# Patient Record
Sex: Male | Born: 1935 | Race: White | Hispanic: No | Marital: Married
Health system: Southern US, Community
[De-identification: ages and names within clinical notes are randomized; demographics above are authoritative.]

---

## 2006-08-22 ENCOUNTER — Ambulatory Visit: Payer: Self-pay | Admitting: Unknown Physician Specialty

## 2007-01-02 ENCOUNTER — Inpatient Hospital Stay (HOSPITAL_COMMUNITY): Admission: RE | Admit: 2007-01-02 | Discharge: 2007-01-06 | Payer: Self-pay | Admitting: Orthopedic Surgery

## 2007-01-11 ENCOUNTER — Ambulatory Visit: Payer: Self-pay | Admitting: Internal Medicine

## 2007-01-19 ENCOUNTER — Encounter: Payer: Self-pay | Admitting: Orthopedic Surgery

## 2007-01-28 ENCOUNTER — Encounter: Payer: Self-pay | Admitting: Orthopedic Surgery

## 2007-02-03 ENCOUNTER — Ambulatory Visit: Payer: Self-pay | Admitting: Vascular Surgery

## 2007-02-03 ENCOUNTER — Ambulatory Visit: Admission: RE | Admit: 2007-02-03 | Discharge: 2007-02-03 | Payer: Self-pay | Admitting: Orthopedic Surgery

## 2007-02-28 ENCOUNTER — Encounter: Payer: Self-pay | Admitting: Orthopedic Surgery

## 2007-03-30 ENCOUNTER — Encounter: Payer: Self-pay | Admitting: Orthopedic Surgery

## 2007-04-30 ENCOUNTER — Encounter: Payer: Self-pay | Admitting: Orthopedic Surgery

## 2009-11-10 ENCOUNTER — Ambulatory Visit: Payer: Self-pay | Admitting: Unknown Physician Specialty

## 2011-03-12 ENCOUNTER — Ambulatory Visit: Payer: Self-pay | Admitting: Internal Medicine

## 2011-03-16 ENCOUNTER — Ambulatory Visit: Payer: Self-pay | Admitting: Surgery

## 2011-03-18 ENCOUNTER — Observation Stay: Payer: Self-pay | Admitting: Surgery

## 2011-12-13 ENCOUNTER — Ambulatory Visit: Payer: Self-pay | Admitting: Internal Medicine

## 2012-01-15 ENCOUNTER — Emergency Department: Payer: Self-pay | Admitting: *Deleted

## 2012-03-16 ENCOUNTER — Ambulatory Visit: Payer: Self-pay | Admitting: Internal Medicine

## 2012-04-13 ENCOUNTER — Ambulatory Visit: Payer: Self-pay | Admitting: Internal Medicine

## 2014-10-22 LAB — PROTIME-INR
INR: 2.3
PROTHROMBIN TIME: 24.7 s — AB (ref 11.5–14.7)

## 2014-10-22 LAB — CBC WITH DIFFERENTIAL/PLATELET
BASOS ABS: 0 10*3/uL (ref 0.0–0.1)
BASOS PCT: 0.4 %
EOS PCT: 2.8 %
Eosinophil #: 0.3 10*3/uL (ref 0.0–0.7)
HCT: 43.3 % (ref 40.0–52.0)
HGB: 14.4 g/dL (ref 13.0–18.0)
LYMPHS PCT: 25.6 %
Lymphocyte #: 2.6 10*3/uL (ref 1.0–3.6)
MCH: 31 pg (ref 26.0–34.0)
MCHC: 33.3 g/dL (ref 32.0–36.0)
MCV: 93 fL (ref 80–100)
Monocyte #: 1.1 x10 3/mm — ABNORMAL HIGH (ref 0.2–1.0)
Monocyte %: 10.2 %
NEUTROS ABS: 6.3 10*3/uL (ref 1.4–6.5)
NEUTROS PCT: 61 %
PLATELETS: 147 10*3/uL — AB (ref 150–440)
RBC: 4.66 10*6/uL (ref 4.40–5.90)
RDW: 13.7 % (ref 11.5–14.5)
WBC: 10.3 10*3/uL (ref 3.8–10.6)

## 2014-10-22 LAB — TROPONIN I

## 2014-10-22 LAB — BASIC METABOLIC PANEL
Anion Gap: 7 (ref 7–16)
BUN: 23 mg/dL — AB (ref 7–18)
CALCIUM: 8.6 mg/dL (ref 8.5–10.1)
Chloride: 106 mmol/L (ref 98–107)
Co2: 30 mmol/L (ref 21–32)
Creatinine: 1.15 mg/dL (ref 0.60–1.30)
EGFR (African American): >60
Glucose: 100 mg/dL — ABNORMAL HIGH (ref 65–99)
OSMOLALITY: 289 (ref 275–301)
Potassium: 3.6 mmol/L (ref 3.5–5.1)
Sodium: 143 mmol/L (ref 136–145)

## 2014-10-23 ENCOUNTER — Observation Stay: Payer: Self-pay | Admitting: Internal Medicine

## 2014-10-23 LAB — LIPASE, BLOOD: Lipase: 71 U/L — ABNORMAL LOW (ref 73–393)

## 2014-10-24 LAB — CBC WITH DIFFERENTIAL/PLATELET
BASOS ABS: 0 10*3/uL (ref 0.0–0.1)
BASOS PCT: 0.3 %
EOS ABS: 0.2 10*3/uL (ref 0.0–0.7)
Eosinophil %: 2.5 %
HCT: 35.9 % — ABNORMAL LOW (ref 40.0–52.0)
HGB: 11.7 g/dL — ABNORMAL LOW (ref 13.0–18.0)
Lymphocyte #: 1.3 10*3/uL (ref 1.0–3.6)
Lymphocyte %: 16.4 %
MCH: 30.7 pg (ref 26.0–34.0)
MCHC: 32.7 g/dL (ref 32.0–36.0)
MCV: 94 fL (ref 80–100)
MONO ABS: 0.6 x10 3/mm (ref 0.2–1.0)
MONOS PCT: 8.1 %
NEUTROS PCT: 72.7 %
Neutrophil #: 5.7 10*3/uL (ref 1.4–6.5)
Platelet: 117 10*3/uL — ABNORMAL LOW (ref 150–440)
RBC: 3.82 10*6/uL — ABNORMAL LOW (ref 4.40–5.90)
RDW: 13.5 % (ref 11.5–14.5)
WBC: 7.8 10*3/uL (ref 3.8–10.6)

## 2014-10-24 LAB — BASIC METABOLIC PANEL
ANION GAP: 6 — AB (ref 7–16)
BUN: 24 mg/dL — ABNORMAL HIGH (ref 7–18)
CALCIUM: 7.9 mg/dL — AB (ref 8.5–10.1)
CO2: 31 mmol/L (ref 21–32)
Chloride: 106 mmol/L (ref 98–107)
Creatinine: 1.05 mg/dL (ref 0.60–1.30)
EGFR (African American): >60
EGFR (Non-African Amer.): >60
GLUCOSE: 84 mg/dL (ref 65–99)
OSMOLALITY: 288 (ref 275–301)
Potassium: 3.1 mmol/L — ABNORMAL LOW (ref 3.5–5.1)
Sodium: 143 mmol/L (ref 136–145)

## 2014-10-25 LAB — CBC WITH DIFFERENTIAL/PLATELET
BASOS ABS: 0 10*3/uL (ref 0.0–0.1)
BASOS PCT: 0.3 %
Eosinophil #: 0.2 10*3/uL (ref 0.0–0.7)
Eosinophil %: 2.5 %
HCT: 35.3 % — AB (ref 40.0–52.0)
HGB: 11.8 g/dL — AB (ref 13.0–18.0)
Lymphocyte #: 1.3 10*3/uL (ref 1.0–3.6)
Lymphocyte %: 15.5 %
MCH: 31.2 pg (ref 26.0–34.0)
MCHC: 33.5 g/dL (ref 32.0–36.0)
MCV: 93 fL (ref 80–100)
Monocyte #: 0.9 x10 3/mm (ref 0.2–1.0)
Monocyte %: 10.1 %
NEUTROS ABS: 6.2 10*3/uL (ref 1.4–6.5)
NEUTROS PCT: 71.6 %
PLATELETS: 120 10*3/uL — AB (ref 150–440)
RBC: 3.79 10*6/uL — ABNORMAL LOW (ref 4.40–5.90)
RDW: 13.3 % (ref 11.5–14.5)
WBC: 8.6 10*3/uL (ref 3.8–10.6)

## 2014-10-25 LAB — BASIC METABOLIC PANEL
Anion Gap: 2 — ABNORMAL LOW (ref 7–16)
BUN: 23 mg/dL — ABNORMAL HIGH (ref 7–18)
CHLORIDE: 102 mmol/L (ref 98–107)
Calcium, Total: 8.1 mg/dL — ABNORMAL LOW (ref 8.5–10.1)
Co2: 35 mmol/L — ABNORMAL HIGH (ref 21–32)
Creatinine: 1.1 mg/dL (ref 0.60–1.30)
Glucose: 93 mg/dL (ref 65–99)
Osmolality: 281 (ref 275–301)
POTASSIUM: 3.2 mmol/L — AB (ref 3.5–5.1)
SODIUM: 139 mmol/L (ref 136–145)

## 2014-10-25 LAB — PROTIME-INR
INR: 2
Prothrombin Time: 22.1 secs — ABNORMAL HIGH (ref 11.5–14.7)

## 2014-10-28 ENCOUNTER — Inpatient Hospital Stay: Payer: Self-pay | Admitting: Internal Medicine

## 2014-10-28 DIAGNOSIS — R110 Nausea: Secondary | ICD-10-CM

## 2014-10-28 LAB — CBC WITH DIFFERENTIAL/PLATELET
BASOS ABS: 0.1 10*3/uL (ref 0.0–0.1)
Basophil %: 0.5 %
EOS ABS: 0.1 10*3/uL (ref 0.0–0.7)
EOS PCT: 0.4 %
HCT: 35.6 % — AB (ref 40.0–52.0)
HGB: 11.5 g/dL — AB (ref 13.0–18.0)
LYMPHS ABS: 0.6 10*3/uL — AB (ref 1.0–3.6)
Lymphocyte %: 4.6 %
MCH: 30.5 pg (ref 26.0–34.0)
MCHC: 32.4 g/dL (ref 32.0–36.0)
MCV: 94 fL (ref 80–100)
Monocyte #: 1 x10 3/mm (ref 0.2–1.0)
Monocyte %: 7.5 %
NEUTROS ABS: 11.9 10*3/uL — AB (ref 1.4–6.5)
Neutrophil %: 87 %
PLATELETS: 238 10*3/uL (ref 150–440)
RBC: 3.78 10*6/uL — ABNORMAL LOW (ref 4.40–5.90)
RDW: 13.3 % (ref 11.5–14.5)
WBC: 13.7 10*3/uL — ABNORMAL HIGH (ref 3.8–10.6)

## 2014-10-28 LAB — URINALYSIS, COMPLETE
BILIRUBIN, UR: NEGATIVE
BLOOD: NEGATIVE
GLUCOSE, UR: NEGATIVE mg/dL (ref 0–75)
Hyaline Cast: 6
Ketone: NEGATIVE
Leukocyte Esterase: NEGATIVE
Nitrite: NEGATIVE
PH: 5 (ref 4.5–8.0)
Protein: 30
RBC,UR: 2 /HPF (ref 0–5)
Specific Gravity: 1.027 (ref 1.003–1.030)
WBC UR: 3 /HPF (ref 0–5)

## 2014-10-28 LAB — PROTIME-INR
INR: 2.6
PROTHROMBIN TIME: 27.3 s — AB (ref 11.5–14.7)

## 2014-10-28 LAB — COMPREHENSIVE METABOLIC PANEL
ANION GAP: 7 (ref 7–16)
AST: 20 U/L (ref 15–37)
Albumin: 2.7 g/dL — ABNORMAL LOW (ref 3.4–5.0)
Alkaline Phosphatase: 88 U/L
BILIRUBIN TOTAL: 1.5 mg/dL — AB (ref 0.2–1.0)
BUN: 30 mg/dL — AB (ref 7–18)
CALCIUM: 8.8 mg/dL (ref 8.5–10.1)
CO2: 30 mmol/L (ref 21–32)
Chloride: 101 mmol/L (ref 98–107)
Creatinine: 1.49 mg/dL — ABNORMAL HIGH (ref 0.60–1.30)
EGFR (African American): 59 — ABNORMAL LOW
EGFR (Non-African Amer.): 48 — ABNORMAL LOW
Glucose: 137 mg/dL — ABNORMAL HIGH (ref 65–99)
OSMOLALITY: 284 (ref 275–301)
Potassium: 4.2 mmol/L (ref 3.5–5.1)
SGPT (ALT): 18 U/L
Sodium: 138 mmol/L (ref 136–145)
TOTAL PROTEIN: 6.1 g/dL — AB (ref 6.4–8.2)

## 2014-10-28 LAB — TROPONIN I: Troponin-I: <0.02 ng/mL

## 2014-10-29 LAB — BASIC METABOLIC PANEL
Anion Gap: 9 (ref 7–16)
BUN: 30 mg/dL — AB (ref 7–18)
CREATININE: 1.44 mg/dL — AB (ref 0.60–1.30)
Calcium, Total: 8.2 mg/dL — ABNORMAL LOW (ref 8.5–10.1)
Chloride: 102 mmol/L (ref 98–107)
Co2: 28 mmol/L (ref 21–32)
EGFR (African American): >60
EGFR (Non-African Amer.): 50 — ABNORMAL LOW
Glucose: 103 mg/dL — ABNORMAL HIGH (ref 65–99)
OSMOLALITY: 284 (ref 275–301)
POTASSIUM: 3.4 mmol/L — AB (ref 3.5–5.1)
SODIUM: 139 mmol/L (ref 136–145)

## 2014-10-29 LAB — CBC WITH DIFFERENTIAL/PLATELET
BASOS ABS: 0 10*3/uL (ref 0.0–0.1)
Basophil %: 0.1 %
EOS ABS: 0.2 10*3/uL (ref 0.0–0.7)
EOS PCT: 1.9 %
HCT: 32.8 % — AB (ref 40.0–52.0)
HGB: 10.8 g/dL — ABNORMAL LOW (ref 13.0–18.0)
LYMPHS PCT: 14.3 %
Lymphocyte #: 1.5 10*3/uL (ref 1.0–3.6)
MCH: 30.7 pg (ref 26.0–34.0)
MCHC: 32.8 g/dL (ref 32.0–36.0)
MCV: 94 fL (ref 80–100)
Monocyte #: 1 x10 3/mm (ref 0.2–1.0)
Monocyte %: 10 %
NEUTROS PCT: 73.7 %
Neutrophil #: 7.5 10*3/uL — ABNORMAL HIGH (ref 1.4–6.5)
PLATELETS: 192 10*3/uL (ref 150–440)
RBC: 3.51 10*6/uL — ABNORMAL LOW (ref 4.40–5.90)
RDW: 13.4 % (ref 11.5–14.5)
WBC: 10.2 10*3/uL (ref 3.8–10.6)

## 2014-10-29 LAB — MAGNESIUM: MAGNESIUM: 1.8 mg/dL

## 2014-10-30 LAB — CBC WITH DIFFERENTIAL/PLATELET
BASOS ABS: 0 10*3/uL (ref 0.0–0.1)
BASOS PCT: 0.3 %
Eosinophil #: 0.3 10*3/uL (ref 0.0–0.7)
Eosinophil %: 3.5 %
HCT: 31.9 % — AB (ref 40.0–52.0)
HGB: 10.6 g/dL — AB (ref 13.0–18.0)
LYMPHS PCT: 10.4 %
Lymphocyte #: 1 10*3/uL (ref 1.0–3.6)
MCH: 31 pg (ref 26.0–34.0)
MCHC: 33.2 g/dL (ref 32.0–36.0)
MCV: 93 fL (ref 80–100)
Monocyte #: 1.2 x10 3/mm — ABNORMAL HIGH (ref 0.2–1.0)
Monocyte %: 12.9 %
Neutrophil #: 6.8 10*3/uL — ABNORMAL HIGH (ref 1.4–6.5)
Neutrophil %: 72.9 %
Platelet: 212 10*3/uL (ref 150–440)
RBC: 3.42 10*6/uL — AB (ref 4.40–5.90)
RDW: 13.6 % (ref 11.5–14.5)
WBC: 9.3 10*3/uL (ref 3.8–10.6)

## 2014-10-30 LAB — COMPREHENSIVE METABOLIC PANEL
ALK PHOS: 78 U/L
AST: 21 U/L (ref 15–37)
Albumin: 2.1 g/dL — ABNORMAL LOW (ref 3.4–5.0)
Anion Gap: 5 — ABNORMAL LOW (ref 7–16)
BUN: 26 mg/dL — ABNORMAL HIGH (ref 7–18)
Bilirubin,Total: 1.1 mg/dL — ABNORMAL HIGH (ref 0.2–1.0)
CO2: 27 mmol/L (ref 21–32)
CREATININE: 1.34 mg/dL — AB (ref 0.60–1.30)
Calcium, Total: 8.4 mg/dL — ABNORMAL LOW (ref 8.5–10.1)
Chloride: 107 mmol/L (ref 98–107)
EGFR (African American): >60
GFR CALC NON AF AMER: 55 — AB
Glucose: 120 mg/dL — ABNORMAL HIGH (ref 65–99)
OSMOLALITY: 283 (ref 275–301)
Potassium: 4.3 mmol/L (ref 3.5–5.1)
SGPT (ALT): 24 U/L
SODIUM: 139 mmol/L (ref 136–145)
TOTAL PROTEIN: 5.1 g/dL — AB (ref 6.4–8.2)

## 2014-10-30 LAB — PROTIME-INR
INR: 2.3
Prothrombin Time: 24.6 secs — ABNORMAL HIGH (ref 11.5–14.7)

## 2014-10-31 ENCOUNTER — Encounter: Payer: Self-pay | Admitting: Internal Medicine

## 2014-10-31 LAB — HEMATOCRIT: HCT: 33.6 % — ABNORMAL LOW (ref 40.0–52.0)

## 2014-10-31 LAB — PROTIME-INR
INR: 1.8
PROTHROMBIN TIME: 20.6 s — AB (ref 11.5–14.7)

## 2014-10-31 LAB — CREATININE, SERUM
Creatinine: 1.42 mg/dL — ABNORMAL HIGH (ref 0.60–1.30)
EGFR (Non-African Amer.): 51 — ABNORMAL LOW

## 2014-10-31 LAB — VANCOMYCIN, TROUGH: VANCOMYCIN, TROUGH: 15 ug/mL (ref 10–20)

## 2014-11-01 LAB — PROTIME-INR
INR: 1.9
PROTHROMBIN TIME: 21 s — AB (ref 11.5–14.7)

## 2014-11-01 LAB — WOUND CULTURE

## 2014-11-02 LAB — CBC WITH DIFFERENTIAL/PLATELET
BASOS PCT: 0.4 %
Basophil #: 0 10*3/uL (ref 0.0–0.1)
EOS ABS: 0.5 10*3/uL (ref 0.0–0.7)
Eosinophil %: 4.8 %
HCT: 32.1 % — AB (ref 40.0–52.0)
HGB: 10.7 g/dL — AB (ref 13.0–18.0)
LYMPHS ABS: 1.1 10*3/uL (ref 1.0–3.6)
LYMPHS PCT: 11.1 %
MCH: 30.7 pg (ref 26.0–34.0)
MCHC: 33.3 g/dL (ref 32.0–36.0)
MCV: 92 fL (ref 80–100)
MONO ABS: 0.8 x10 3/mm (ref 0.2–1.0)
Monocyte %: 7.9 %
NEUTROS PCT: 75.8 %
Neutrophil #: 7.8 10*3/uL — ABNORMAL HIGH (ref 1.4–6.5)
Platelet: 300 10*3/uL (ref 150–440)
RBC: 3.48 10*6/uL — AB (ref 4.40–5.90)
RDW: 13.4 % (ref 11.5–14.5)
WBC: 10.3 10*3/uL (ref 3.8–10.6)

## 2014-11-02 LAB — BASIC METABOLIC PANEL
Anion Gap: 3 — ABNORMAL LOW (ref 7–16)
BUN: 29 mg/dL — AB (ref 7–18)
CHLORIDE: 106 mmol/L (ref 98–107)
CO2: 28 mmol/L (ref 21–32)
CREATININE: 1.32 mg/dL — AB (ref 0.60–1.30)
Calcium, Total: 8.5 mg/dL (ref 8.5–10.1)
GFR CALC NON AF AMER: 56 — AB
GLUCOSE: 108 mg/dL — AB (ref 65–99)
Osmolality: 280 (ref 275–301)
POTASSIUM: 3.9 mmol/L (ref 3.5–5.1)
SODIUM: 137 mmol/L (ref 136–145)

## 2014-11-02 LAB — CULTURE, BLOOD (SINGLE)

## 2014-11-02 LAB — PROTIME-INR
INR: 1.8
Prothrombin Time: 20.9 secs — ABNORMAL HIGH (ref 11.5–14.7)

## 2014-11-03 LAB — CBC WITH DIFFERENTIAL/PLATELET
BASOS ABS: 0 10*3/uL (ref 0.0–0.1)
Basophil %: 0.4 %
Eosinophil #: 0.4 10*3/uL (ref 0.0–0.7)
Eosinophil %: 3.8 %
HCT: 34 % — AB (ref 40.0–52.0)
HGB: 11.2 g/dL — ABNORMAL LOW (ref 13.0–18.0)
LYMPHS ABS: 1.2 10*3/uL (ref 1.0–3.6)
Lymphocyte %: 11.2 %
MCH: 30.1 pg (ref 26.0–34.0)
MCHC: 33.1 g/dL (ref 32.0–36.0)
MCV: 91 fL (ref 80–100)
MONO ABS: 0.7 x10 3/mm (ref 0.2–1.0)
Monocyte %: 6.8 %
NEUTROS ABS: 8.2 10*3/uL — AB (ref 1.4–6.5)
NEUTROS PCT: 77.8 %
Platelet: 348 10*3/uL (ref 150–440)
RBC: 3.73 10*6/uL — AB (ref 4.40–5.90)
RDW: 13.4 % (ref 11.5–14.5)
WBC: 10.6 10*3/uL (ref 3.8–10.6)

## 2014-11-03 LAB — BASIC METABOLIC PANEL
ANION GAP: 10 (ref 7–16)
BUN: 26 mg/dL — ABNORMAL HIGH (ref 7–18)
CREATININE: 1.22 mg/dL (ref 0.60–1.30)
Calcium, Total: 8.3 mg/dL — ABNORMAL LOW (ref 8.5–10.1)
Chloride: 106 mmol/L (ref 98–107)
Co2: 27 mmol/L (ref 21–32)
EGFR (Non-African Amer.): >60
Glucose: 112 mg/dL — ABNORMAL HIGH (ref 65–99)
Osmolality: 290 (ref 275–301)
Potassium: 3.9 mmol/L (ref 3.5–5.1)
Sodium: 143 mmol/L (ref 136–145)

## 2014-11-03 LAB — PROTIME-INR
INR: 1.8
Prothrombin Time: 20.2 secs — ABNORMAL HIGH (ref 11.5–14.7)

## 2014-11-03 LAB — VANCOMYCIN, TROUGH: VANCOMYCIN, TROUGH: 17 ug/mL (ref 10–20)

## 2014-11-04 LAB — CBC WITH DIFFERENTIAL/PLATELET
Basophil #: 0 10*3/uL (ref 0.0–0.1)
Basophil %: 0.4 %
EOS ABS: 0.3 10*3/uL (ref 0.0–0.7)
Eosinophil %: 3.1 %
HCT: 32 % — ABNORMAL LOW (ref 40.0–52.0)
HGB: 10.7 g/dL — AB (ref 13.0–18.0)
Lymphocyte #: 1.2 10*3/uL (ref 1.0–3.6)
Lymphocyte %: 12.1 %
MCH: 30.2 pg (ref 26.0–34.0)
MCHC: 33.4 g/dL (ref 32.0–36.0)
MCV: 91 fL (ref 80–100)
MONOS PCT: 7.1 %
Monocyte #: 0.7 x10 3/mm (ref 0.2–1.0)
NEUTROS PCT: 77.3 %
Neutrophil #: 7.7 10*3/uL — ABNORMAL HIGH (ref 1.4–6.5)
PLATELETS: 327 10*3/uL (ref 150–440)
RBC: 3.54 10*6/uL — AB (ref 4.40–5.90)
RDW: 13.3 % (ref 11.5–14.5)
WBC: 9.9 10*3/uL (ref 3.8–10.6)

## 2014-11-04 LAB — BASIC METABOLIC PANEL
Anion Gap: 9 (ref 7–16)
BUN: 22 mg/dL — ABNORMAL HIGH (ref 7–18)
CALCIUM: 8.5 mg/dL (ref 8.5–10.1)
Chloride: 106 mmol/L (ref 98–107)
Co2: 28 mmol/L (ref 21–32)
Creatinine: 1.15 mg/dL (ref 0.60–1.30)
EGFR (African American): >60
Glucose: 97 mg/dL (ref 65–99)
OSMOLALITY: 288 (ref 275–301)
Potassium: 3.7 mmol/L (ref 3.5–5.1)
Sodium: 143 mmol/L (ref 136–145)

## 2014-11-04 LAB — PROTIME-INR
INR: 1.8
Prothrombin Time: 20.9 secs — ABNORMAL HIGH (ref 11.5–14.7)

## 2014-11-08 ENCOUNTER — Encounter: Payer: Self-pay | Admitting: Surgery

## 2014-11-29 ENCOUNTER — Encounter: Payer: Self-pay | Admitting: Surgery

## 2014-11-29 ENCOUNTER — Encounter: Payer: Self-pay | Admitting: Internal Medicine

## 2014-12-30 ENCOUNTER — Encounter: Payer: Self-pay | Admitting: Surgery

## 2015-01-01 ENCOUNTER — Ambulatory Visit: Payer: Self-pay | Admitting: Internal Medicine

## 2015-01-28 ENCOUNTER — Encounter
Admit: 2015-01-28 | Disposition: A | Discharge status: Home / Self Care | Payer: Self-pay | Attending: Surgery | Admitting: Surgery

## 2015-02-28 ENCOUNTER — Encounter
Admit: 2015-02-28 | Disposition: A | Discharge status: Home / Self Care | Payer: Self-pay | Attending: Surgery | Admitting: Surgery

## 2015-03-22 NOTE — Discharge Summary (Signed)
PATIENT NAME:  Nathan Kirby, Nathan Kirby MR#:  [id number] DATE OF BIRTH:  [birth year]  DATE OF ADMISSION:  10/23/2014 DATE OF DISCHARGE:    REASON FOR ADMISSION: Left lower extremity trauma with laceration.   HISTORY OF PRESENT ILLNESS: Please see the dictated HPI done by Dr. Sheryle Hailiamond on 10/23/2014.   PAST MEDICAL HISTORY: 1.  Chronic atrial fibrillation, on anticoagulation.  2.  History of DVT.   3.  Status post left knee surgery.  4.  Chronic peripheral edema.  5.  History of squamous cell melanoma.  6.  Status post cholecystectomy.  7.  History of CHF.    MEDICATIONS ON ADMISSION: Please see admission note.   ALLERGIES: IODINE, CONTRAST DYE, AND SHELLFISH.   SOCIAL HISTORY: Negative for alcohol or tobacco abuse.   FAMILY HISTORY: Positive for hypertension and stroke.   REVIEW OF SYSTEMS: As per admission note.   PHYSICAL EXAMINATION: GENERAL: The patient was in no acute distress.  VITAL SIGNS: Stable and he was afebrile.  HEENT: Unremarkable.  NECK: Supple without JVD.  LUNGS: Clear.  CARDIAC: Irregularly irregular rhythm.  ABDOMEN: Soft and nontender.  EXTREMITIES: Revealed 2+ edema.  NEUROLOGIC: Grossly nonfocal.   HOSPITAL COURSE: The patient was admitted with left lower extremity trauma and laceration. His Coumadin was held. He was seen in consultation by surgery. Sutures were applied with a Betadine dressing. The patient developed severe left lower extremity pain. Ultrasound was negative for DVT. He was placed back on his Coumadin. This morning, he is asymptomatic. No fever. No sign of infection. His INR is therapeutic. He is now discharged home in stable condition.   DISCHARGE DIAGNOSES: 1.  Left lower extremity trauma with laceration requiring stitching.  2.  Chronic atrial fibrillation.  3.  History of deep vein thrombosis.  4.  History of congestive heart failure.  5.  Chronic peripheral edema.   DISCHARGE MEDICATIONS: 1.  Norco 5/325, 1 p.o. to 2 p.o. q. 4 hours  p.r.n. pain.  2.  Lipitor 40 mg p.o. daily.  3.  Lasix 40 mg p.o. daily.  4.  Lopressor 100 mg p.o. b.i.d.  5.  K-Dur 10 mEq p.o. daily.  6.  Flomax 0.4 mg p.o. daily.  7.  Coumadin 4 mg p.o. q.p.m.  8.  Keflex 500 mg p.o. q. 8 hours.   FOLLOWUP PLANS AND APPOINTMENTS: The patient was discharged on a low sodium diet. He will follow up with Dr. Graciela HusbandsKlein next week, sooner if needed.    ____________________________ Duane LopeJeffrey D. Judithann SheenSparks, MD jds:at D: 10/25/2014 08:50:58 ET T: 10/25/2014 09:26:18 ET JOB#: [id number]  cc: Duane LopeJeffrey D. Judithann SheenSparks, MD, <Dictator> Culver Feighner Rodena Medin Elasha Tess MD ELECTRONICALLY SIGNED 10/28/2014 21:13

## 2015-03-22 NOTE — Op Note (Signed)
PATIENT NAME:  Nathan Kirby, Nathan Kirby DATE OF BIRTH:  1933/08/18  DATE OF PROCEDURE:  10/28/2014  PREOPERATIVE DIAGNOSIS: Hematoma with cellulitis of the left leg with skin necrosis.   POSTOPERATIVE DIAGNOSIS: Hematoma with cellulitis of the left leg with skin necrosis.   PROCEDURE: Incision and drainage of hematoma of the left leg with sharp debridement.   DESCRIPTION OF PROCEDURE:  With the patient in the hospital bed the dressing was removed from the left leg, seeing the wound with sutures intact and a black dry eschar. This wound spanned some 4.5 cm. There was surrounding erythema and swelling.    The site was prepared with alcohol solution.    The nylon sutures were removed. Next the eschar was sharply excised with a scalpel and also some use of scissors. This eschar was approximately 1 x 3 cm in dimension. There was hematoma formation beneath this which was evacuated with the surgical instruments and also applied some local pressure which helped to cause hematoma to come out, removing approximately 3-4 mL of hematoma. The hematoma was cultured and culture swab was sent in transport  media to the laboratory for routine culture.  It is noted that there was no active bleeding during this procedure and it was tolerated well. 4 x 4 gauze dressings were applied with a Kerlix and also a 4 inch Ace wrap which the Ace wrap was begun at the ball of the foot and carried up to the top of the dressing.   Wound care instructions passed along through the medical record system to the nurses and also discussed with nurse wound care.     ____________________________ J. Renda RollsWilton Smith, MD jws:bu D: 10/28/2014 18:11:56 ET T: 10/28/2014 19:43:34 ET JOB#: [id number]  cc: Adella HareJ. Nathan Smith, MD, <Dictator> Adella HareWILTON J SMITH MD ELECTRONICALLY SIGNED 11/08/2014 19:27

## 2015-03-22 NOTE — H&P (Signed)
PATIENT NAME:  Nathan Kirby, Nathan Kirby MR#:  [id number] DATE OF BIRTH:  Aug 31, 1933  DATE OF ADMISSION:  10/23/2014  REFERRING PHYSICIAN: Dr. Langston MaskerShaevitz.  PRIMARY CARE PHYSICIAN: Lynnea FerrierBert J. Klein III, M.D.   ADMISSION DIAGNOSIS: Intractable vomiting.   HISTORY OF PRESENT ILLNESS: This is a 81 year old, Caucasian male, who presents to the Emergency Department after suffering a fall. The patient lost his footing and fell onto a wash pale that he was holding in his hand. He landed on his chest. The patient has a history of chronic atrial fibrillation and is on anticoagulation. He decided to come to the Emergency Department for evaluation, also because he cut his left lower extremity on a rock wall. In the Emergency Department, he had a normal head CT and a CT of his abdomen, which did not reveal any organ damage or internal bleeding, but his leg did require suturing. He had some significant oozing as his INR is elevated from anticoagulation. A pressure dressing was placed over the wound and hemostasis was eventually achieved. Throughout this course, however, the patient has had multiple episodes of nonbloody vomiting that was very bilious in appearance. After multiple doses of antiemetic, he still felt nauseous and had some vomiting, which prompted the Emergency Department to call for admission.   REVIEW OF SYSTEMS:    CONSTITUTIONAL: The patient denies fever or weakness.   EYES: Denies inflammation or blurred vision.  ENT: Denies tinnitus or sore throat.  RESPIRATORY: Denies cough or shortness of breath.  CARDIOVASCULAR: Denies palpitations, orthopnea, or paroxysmal nocturnal dyspnea. The patient does have reproducible chest pain that is across the upper quadrants of his chest.  GASTROINTESTINAL: Denies diarrhea, but admits to nausea, vomiting and abdominal pain.  GENITOURINARY: Denies dysuria, increased frequency, or hesitancy of urination.  ENDOCRINE: Denies polyuria, polydipsia.  HEMATOLOGIC AND LYMPHATIC:  Admits to easy bruising and easy bleeding.  INTEGUMENTARY: Denies rashes or lesions.  MUSCULOSKELETAL: Denies myalgias or arthralgias.  NEUROLOGIC: Denies numbness in his extremities or dysarthria.  PSYCHIATRIC: Denies depression or suicidal ideation.   PAST MEDICAL HISTORY: Atrial fibrillation and congestive heart failure.   SURGICAL HISTORY: Left total knee replacement, cholecystectomy, and squamous cell melanoma removal from his scalp and lower right extremity.   SOCIAL HISTORY: The patient lives with his wife. He does not smoke, drink or do any drugs. He is very able-bodied and able to perform his activities of daily living.   FAMILY HISTORY: Hypertension. Both of his parents are deceased of strokes.    MEDICATIONS: 1. Acetaminophen with hydrocodone 500 mg/5 mg oral capsule 1 to 2 tablets p.o. q.6 h. p.r.n.  2. Verapamil 180 mg extended release capsule 1 capsule p.o. daily.  3. Coumadin 4 mg 1 tablet p.o. daily.  4. Atorvastatin 40 mg 1 tablet p.o. every morning. 5. Diovan HCT 160 mg/12.5 mg tablet 1 tablet p.o. b.i.d.  6. Plavix 1 tablet p.o. daily.  7. Lopressor 1 tablet p.o. b.i.d.  8. Potassium chloride 10 mEq extended release 1 tablet p.o. every morning.  9. Prilosec 10 mg delayed release capsule 1 capsule p.o. as needed.   ALLERGIES: IODINE CONTRAST DYE, AS WELL AS SHELLFISH.   PERTINENT LABORATORY RESULTS AND RADIOGRAPHIC FINDINGS: Glucose is 100, BUN is 23, creatinine 1.15, sodium 143, potassium is 3.6, serum chloride is 106, bicarbonate is 30, calcium is 8.6. Troponin is negative. White blood cell count is 10.3, hemoglobin 14.4, hematocrit 43.3, platelet count is 147,000, MCV is 93. INR is 2.3, lipase 71.   X-ray of his left  tibia and fibula shows soft tissue changes consistent with localized hemorrhage. There are no bony abnormalities seen.   CT scan of the chest and abdomen shows no evidence of acute visceral injury. There are multiple small pulmonary nodules identified.  There is some mild soft tissue change consistent with localized bruising in the lateral abdominal walls, but no hematoma is identified.   CT scan of the head shows no acute intracranial abnormality.   CT scan of the spine shows multilevel degenerative changes without acute abnormality.   PHYSICAL EXAMINATION:  VITAL SIGNS: Temperature is 97.4, pulse 63, respirations 18, blood pressure is 130/78, pulse oximetry is 95% on room air.  GENERAL: The patient is alert and oriented x 3 in no apparent distress.  HEENT: Normocephalic, atraumatic. Pupils equal, round, and reactive to light and accommodation. Extraocular movements are intact. Mucous membranes are moist.  NECK: Trachea is midline. No adenopathy.  CHEST: Symmetric and atraumatic.  CARDIOVASCULAR: Regular rate and rhythm. Normal S1, S2. No rubs, clicks, or murmurs appreciated.  LUNGS: Clear to auscultation bilaterally. Normal effort and excursion.  ABDOMEN: Positive bowel sounds. Soft, mildly tender, and an area of bruising under the left rib cage. There is no rebound tenderness. There is no hepatosplenomegaly. The abdomen is soft without any evidence of increased intra-abdominal pressure.  GENITOURINARY: Normal external male genitalia.  MUSCULOSKELETAL: The patient moves all 4 extremities equally. There is 5/5 strength in upper and lower extremities bilaterally.  SKIN: No rashes, but the patient does have a laceration that is dressed with absorbent pads and Ace wraps. I have not removed the dressing so as not to disrupt hemostasis.  EXTREMITIES: No clubbing or cyanosis, but there is 1+ pitting edema of his feet, which is a chronic problem for the patient.  NEUROLOGIC: Cranial nerves II through XII are grossly intact.  PSYCHIATRIC: Mood is normal. Affect is congruent.   ASSESSMENT AND PLAN: This is a 81 year old male admitted for intractable vomiting.   1. His vomiting is bilious, but his CT scan of the abdomen is reassuring. There is no  organ damage or internal hemorrhage. We have ordered antiemetics for supportive care.  2. Atrial fibrillation. The patient is on Coumadin. His INR is 2.3. We will continue his anticoagulation as there is no evidence of acute intra-abdominal bleed.  3. Congestive heart failure. The patient is stable. He will require no intervention for this problem.  4. Left lower extremity laceration. The wound has been approximated and good hemostasis achieved with a pressure dressing. Surgical consult was obtained and does not recommend any further intervention at this time.  5. Deep vein thrombosis prophylaxis, mechanical.  6. Gastrointestinal prophylaxis, none.   CODE STATUS: The patient is a Full Code.   TIME SPENT ON ADMISSION ORDERS AND PATIENT CARE: Approximately 35 minutes.    ____________________________ Kelton Pillar. Sheryle Hail, MD msd:JT D: 10/23/2014 07:32:48 ET T: 10/23/2014 09:02:29 ET JOB#: [id number]  cc: Kelton Pillar. Sheryle Hail, MD, <Dictator> Kelton Pillar Lamoyne Palencia MD ELECTRONICALLY SIGNED 10/30/2014 23:58

## 2015-03-22 NOTE — Consult Note (Signed)
PATIENT NAME:  Nathan Kirby, Nathan Kirby MR#:  [id number] DATE OF BIRTH:  September 29, 1933  DATE OF CONSULTATION:  10/28/2014  CONSULTING PHYSICIAN:  Adella HareJ. Wilton Karynn Deblasi, MD  HISTORY OF PRESENT ILLNESS: This 81 year old male has a chief complaint of pain and swelling and erythema of the left lower leg. He has a history of chronic bilateral lower extremity edema, and last week had a fall sustaining a laceration of the anterior tibial aspect of the left leg. He has been taking warfarin and was anticoagulated and had initial evaluation last week by the Emergency Room staff, which included suture of the laceration which there was some bleeding during the procedure. He later was admitted to the hospital for 2 nights and had a period of observation with bed rest and elevation of the left leg with dressing changes using 4 x 4 cotton gauze, Kerlix and Ace wraps.  Since discharge, he has developed increased pain at this site and also developed some hypotension and weakness. He reports low-grade fever. He was seen by Dr. Daniel NonesBert Klein at the office this morning and was admitted to the hospital for further care. Was found to be hypotensive. He has been admitted and has been started on intravenous vancomycin and intravenous Levaquin.   PAST MEDICAL HISTORY: Does include atrial fibrillation, hypertension, chronic lower extremity edema.   MEDICATIONS: Include: 1.  Norco. 2.  Verapamil. 3.  Coumadin.  4.  Keflex.  5.  Lopressor.  6.  Potassium supplement. 7.  Flomax. 8.  Lasix. 9.  Lipitor.   REVIEW OF SYSTEMS: He reports weakness, but no dizziness. Does tend to get dyspneic  if he lays flat in bed consistent with orthopnea. He has been sleeping in a recliner chair at home.   PHYSICAL EXAMINATION:  GENERAL: He is in the hospital bed and awake, alert, and oriented.  VITAL SIGNS: Temperature 98, pulse 62, respirations 18, blood pressure 96/50.  LOWER EXTREMITIES: The dressing was removed from the anterior tibial region of the  left leg demonstrating diffuse edema of the leg and also diffuse edema of the opposite leg. There was a finding of erythema which is extending approximately 20 cm with some being below and some above the wound. There is local eschar formation, which is fluctuant, sutures remaining intact with dry wound, and some surrounding edema.   IMPRESSION: 1.  Skin necrosis at the site of his laceration.  2.  Hematoma.  3.  Cellulitis.   PLAN: 1.  I recommended incision and drainage of the hematoma with culture and debridement. Description of this will be dictated in a separate note.  2.  Agree with antibiotic treatment.  3.  I discussed with him bed rest with elevation of the left leg as high as possible related to his heart. Currently, he has two pillows under his leg and I discussed lowering the head of the bed as much as he can tolerate with his breathing but also walk some with assistance and with a Wadel to maintain strength.  4.  Culture swab has been sent.    ____________________________ Shela CommonsJ. Renda RollsWilton Yahayra Geis, MD jws:LT D: 10/28/2014 18:07:31 ET T: 10/28/2014 18:47:16 ET JOB#: [id number]  cc: Adella HareJ. Wilton Rito Lecomte, MD, <Dictator> Adella HareWILTON J Wylene Weissman MD ELECTRONICALLY SIGNED 11/08/2014 19:26

## 2015-03-22 NOTE — Discharge Summary (Signed)
PATIENT NAME:  Nathan Kirby, Nathan Kirby MR#:  [id number] DATE OF BIRTH:  [birth year]  DATE OF ADMISSION:  10/28/2014 DATE OF DISCHARGE:  11/04/2014  FINAL DIAGNOSES:  1. Left leg infected hematoma.  2. Left leg cellulitis.  3. Sepsis secondary to #1 and #2.  4. Atrial fibrillation, persistent.  5. Essential hypertension.  6. Chronic lower extremity edema and venous stasis.  7. Benign prostatic hypertrophy.  8. Hyperlipidemia.   PRINCIPLE PROCEDURES: Debridement of left leg wounds x2.   SUMMARY OF HOSPITAL COURSE: The patient was admitted after being found to have hypotension, dehydration, evidence of sepsis in the office, with finding of an infected hematoma in the left leg with cellulitis and necrotic tissue over the wound. Surgery was consultation and he underwent debridement of the area. He was placed on broad-spectrum antibiotics. Cultures were sent out, with coag negative staph growing out, which is likely a contaminant rather than a true pathogen. He was transitioned over to doxycycline and Augmentin. He remained afebrile, his blood pressure began to climb, and his dehydration resolved after IV fluid supplementation. His level of pain continued to limit his ambulation, however, as the leg wound improved, this improved significantly.  On the day of discharge, he is ambulating in the room independently with a Tompson, although his distance to ambulate remains a short. Initially, we had anticipated him going for skilled nursing, however, with improvement in both the wound and with the patient's request to go home, the plan will be now to discharge him to home with home health, physical therapy and nursing. He will be discharged in stable condition. His physical activity should be up with a Leder as tolerated. He should follow a 2 gram sodium diet. A followup will be set up with the wound clinic in we will have him follow up in our office in the next 5 to 6 days. Home health nursing and physical therapy  were ordered for the patient. He should keep the leg elevated, will need to keep it dry. Recommendation is for the leg wound to be cleansed with normal saline and patted gently dry. The wound should be covered with an 8 inch layer of Santyl cream, then topped with normal saline moistened gauze. This should then be covered with a 4 x 4 gauze and Kerlix. An ACE wrap should be applied over the top of this, starting at the base of the toes are wrapping to just below the knee. All of that will need to be done daily.   DISCHARGE MEDICATIONS:  1. Prilosec 10 mg p.o. daily.  2. Norco 5/325 1 p.o. q.4h. as needed for pain.  3. Tamsulosin 0.4 mg p.o. at bedtime.  4. Atorvastatin 40 mg p.o. at bedtime. 5. Metoprolol 100 mg p.o. b.i.d.  6. Coumadin 4 mg p.o. daily.  7. Diovan HCTZ 160/12.5 mg 1 p.o. daily.  8. Furosemide 40 mg p.o. daily as needed for leg edema.  9. Potassium 10 mEq p.o. daily as needed with furosemide.  10. Santyl cream 250 units/g, 1 application topically daily until the wound is healed.  11. Augmentin 500/125 mg, 1 p.o. b.i.d. x7 days.  12. Doxycycline 100 mg p.o. b.i.d. x7 days.   He will stop Plavix, hold verapamil for now. Stop Keflex. Of note, the patient's furosemide, Diovan HCTZ, and potassium doses were changed and instructions were given to the patient to make sure he took these medi cations as per the new directions.     ____________________________ Lynnea Ferrier, MD bjk:ap  D: 11/04/2014 13:11:00 ET T: 11/04/2014 13:28:26 ET JOB#: [id number]  cc: Lynnea FerrierBert J. Magda Muise III, MD, <Dictator> Daniel NonesBERT Bethel Gaglio MD ELECTRONICALLY SIGNED 11/05/2014 18:34

## 2015-03-22 NOTE — Consult Note (Signed)
PATIENT NAME:  Nathan Kirby, [name] MR#:  [id number] DATE OF BIRTH:  [birth year]  DATE OF CONSULTATION:  10/22/2014  REFERRING PHYSICIAN:  ER physician  CONSULTING PHYSICIAN:  S.G. Evette CristalSankar, MD  REASON FOR CONSULTATION:  Laceration to the left leg with some bleeding that was of concern.   HISTORY OF PRESENT ILLNESS:  This 81 year old male, who is on Coumadin for reportedly recurrent DVTs, had an accident this afternoon. He apparently fell and suffered a laceration on his left leg, which is bleeding significantly. He also had some bruising on his abdomen. The patient was evaluated initially in the ER and subsequently had a CT of his abdomen and x-rays of his leg, and after ensuring no internal injuries, attempt was made to suture the laceration to minimize bleeding. Apparently, an arterial bleeder was encountered, which was subsequently controlled with some mattress sutures, but the patient continued to ooze from the wound and seemed to saturate a dressing a couple of times, and I was asked to assess the wound to see if this was acceptable at this time.   PHYSICAL EXAMINATION:   GENERAL:  The patient appears to be alert and oriented. He is a little bit nauseated from the pain medicines he got, but otherwise he says he feels fine.  EXTREMITIES:  There is some moderate tenderness over the area of the laceration, which is dressed with a pressure dressing at this point. The dressing was then taken down completely to reveal a curved laceration repaired with some nylon sutures with some open spaces in-between. The patient has significant edema in both legs and apparently this has been a chronic problem. He does have a palpable dorsalis pedis pulse; the posterior tibial is somewhat difficult to feel. There is no suggestion of any arterial insufficiency. What is oozing from the wound is just a thin edema fluid mixed with some blood, and it is likely that this may continue because of the edema more so than from active  continued bleeding.   RECOMMENDATIONS:  At this point, it does not appear that there is any reason to reopen the wound, and I would favor pressure dressing to continue for the time being. With this in mind, a small piece of Adaptic, 4 x 4's, Kerlix, and an Ace wrap were applied. The patient was advised to rest and elevate the leg for the next 24 to 48 hours and follow up with Dr. Renda RollsWilton Smith, who has done surgery on him in the past, in the next few days.   Thank you for allowing me to evaluate and help in the care of this patient.     ____________________________ S.Wynona LunaG. Iyanna Drummer, MD sgs:nb D: 10/22/2014 23:19:57 ET T: 10/22/2014 23:37:58 ET JOB#: [id number]  cc: Timoteo ExposeS.G. Evette CristalSankar, MD, <Dictator> [facility] Wynona LunaG Wynton Hufstetler MD ELECTRONICALLY SIGNED 10/23/2014 19:28

## 2015-03-22 NOTE — H&P (Signed)
PATIENT NAME:  Nathan Kirby, Nathan Kirby MR#:  [id number] DATE OF BIRTH:  07/01/1933  DATE OF ADMISSION:  10/28/2014  CHIEF COMPLAINT: Hypotension and left leg pain.   HISTORY OF PRESENT ILLNESS: A 81 year old male who was admitted on 10/23/2014 after a fall. He had tripped while he was walking back from feeding some chickens. He cut his left leg, and had trouble getting this to stop bleeding. He has been chronically on Coumadin and this was part of the issue. He had the area sutured, but wound up in the hospital for several days trying to get his pain under control and get his bleeding to stop. His Coumadin initially had been held at that time. It was restarted before his discharge. He had trouble with nausea and vomiting during his hospitalization as well, and he was hydrated. He was seen by surgery at that time. He had continued difficulty with pain and so was in the hospital until the 27th, at which time he was discharged to home. His nausea and vomiting had resolved, in fact it was felt that this might have actually been due to pain medication.   He returned to the office today, actually showed up in the lobby and refused to go home. He developed nausea in the office, and was initially noted to have a blood pressure of 60/40, improving to 70/50 as his nausea improved. Examination of the wound reveals significant hematoma with oozing, with erythema which appeared to be tracking upward towards the knee, and diffuse tenderness. He had not had fevers, chills, however the area was worrisome for cellulitis, and so he was admitted with nausea, hypotension, likely with a vagal component, in addition to significant residual hematoma, tender, with possibility of left leg cellulitis, with some concern about possible developing sepsis.   PAST MEDICAL HISTORY:  1.  Persistent atrial fibrillation.  2.  Hypertension.  3.  Chronic edema.   ALLERGIES: IODINE AND SHELLFISH.   MEDICATIONS:  1. Norco 5-325, 1 p.o. q. 6  hours p.r.n. pain.  2. Verapamil 180 mg p.o. daily.  3. Coumadin 4 mg p.o. at bedtime.  4. Keflex 500 mg p.o. t.i.d.  5. Lopressor 100 mg p.o. b.i.d.  6. Potassium 10 mEq p.o. daily.  7. Flomax 0.4 mg p.o. at bedtime.  8. Lasix 40 mg p.o. daily.  9. Lipitor 40 mg p.o. daily.   SOCIAL HISTORY: No alcohol or tobacco.   FAMILY HISTORY: Hypertension and stroke.   REVIEW OF SYSTEMS: Please see HPI. Denies chest pain, shortness of breath. No cough. Remainder of complete review of systems is negative.   PHYSICAL EXAMINATION:  VITAL SIGNS: Temperature 97.9, pulse 58, initial blood pressure 60/40, saturation 97% on room air.  GENERAL: Well-developed, well-nourished male, appears ill.  EYES: Pupils are round and reactive to light. Lids and conjunctivae are unremarkable. EARS, NOSE, AND THROAT:  Unremarkable. The oropharynx is dry without lesions.  NECK: Supple, trachea midline. No thyromegaly.  CARDIOVASCULAR: Irregularly irregular without gallops or rubs.  LUNGS: Clear bilaterally without wheeze or retractions.  ABDOMEN: Soft, slight distention, quiet bowel sounds without guard or rebound.  SKIN: Eschar and sutures in place over the left lower extremity with visible bulge from hematoma. Erythema extending around this area extending up the medial aspect of the left lower leg.  LYMPH NODES: No cervical or supraclavicular nodes.  MUSCULOSKELETAL: No clubbing or cyanosis. Chronic edema, left ankle more than right.  NEUROLOGIC: Cranial nerves are intact. Moving all extremities but limited exam secondary to his nausea  and hypotension.   IMPRESSION AND PLAN:  1.  Hypotension. Again the patient appears to be having some vagal component to this. We will treat his nausea, get his pain under control, and administer IV fluids, although will watch for worsening edema. We will need to hold all of his blood pressure medications and diuretics until we see his blood pressure improve.  2.  Hematoma, possible  cellulitis. Start with vancomycin, Levaquin for now, obtain blood cultures. General surgery consultation. Follow exam.  3.  Atrial fibrillation.  Holding Coumadin for now in the event that the area needs to be reopened.  Follow INR, CBC.     ____________________________ Lynnea FerrierBert J. Clearnce Leja III, MD bjk:bu D: 10/28/2014 12:38:52 ET T: 10/28/2014 12:52:17 ET JOB#: [id number]  cc: Lynnea FerrierBert J. Ahonesty Woodfin III, MD, <Dictator> Daniel NonesBERT Dorothe Elmore MD ELECTRONICALLY SIGNED 11/05/2014 18:34

## 2015-03-22 NOTE — Op Note (Signed)
PATIENT NAME:  Nathan Kirby, Christo C MR#:  [id number] DATE OF BIRTH:  [birth year]  DATE OF PROCEDURE:  10/30/2014  PREOPERATIVE DIAGNOSIS:  Hematoma of the left leg.   POSTOPERATIVE DIAGNOSIS:  Hematoma of the left leg.   PROCEDURE: Incision and drainage of hematoma of the left leg with debridement of eschar.   DESCRIPTION OF PROCEDURE:  With the patient in the recumbent position the dressing was removed from the left leg, exposing the wound which there was a dried eschar approximately 2 x 4 cm in dimension. There was some minimal drainage of dark bloody fluid. The eschar was sharply excised with forceps and scissors, removing this eschar up close to the live skin edges. Subsequently it was possible to evacuate approximately 10 mL of clotted blood. The wound was clean multiple times with insertion of 4 x 4 cotton gauze and debridement of the hematoma carried out. This was subsequently packed with 4 x 4 gauze and dressed with 4 x 4 gauze, clean Kerlix, and two 4 inch Ace wraps.   He tolerated this well with minimal discomfort.     ____________________________ J. Renda RollsWilton Johnna Bollier, MD jws:bu D: 10/30/2014 12:53:14 ET T: 10/30/2014 16:56:27 ET JOB#: [id number]  cc: Adella HareJ. Wilton Barrett Holthaus, MD, <Dictator> Adella HareWILTON J Olyvia Gopal MD ELECTRONICALLY SIGNED 11/08/2014 19:27

## 2015-09-06 IMAGING — US US EXTREM LOW VENOUS*L*
1 series · 13 of 24 positions shown · non-contrast
Comparison: No priors.

CLINICAL DATA: 80-year-old male with history of trauma after a fall
on the left leg 2 days ago complaining of pain in the left late for
the past 2 days.



[Series 1: us extrem low venous*left* · 0.09mm/px · 13 of 31 slices shown]
[im 1/31]
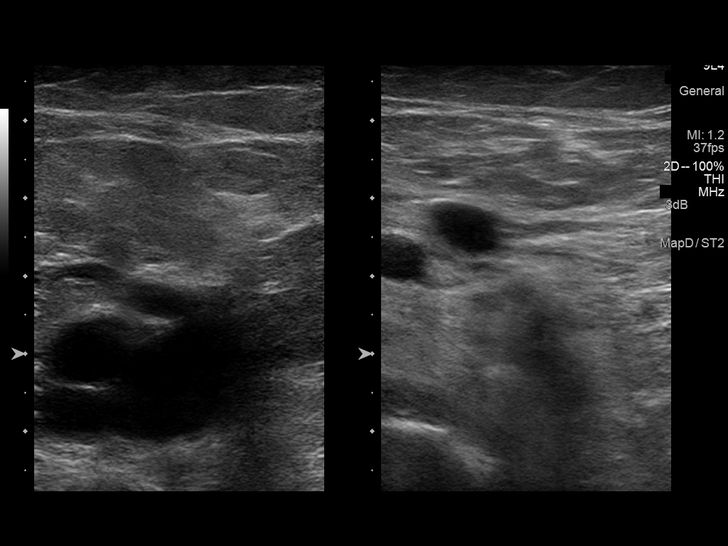
[im 3/31]
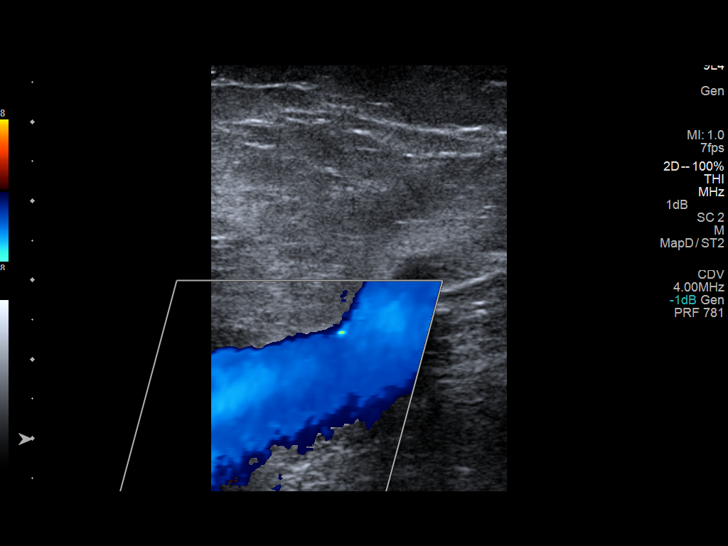
[im 6/31]
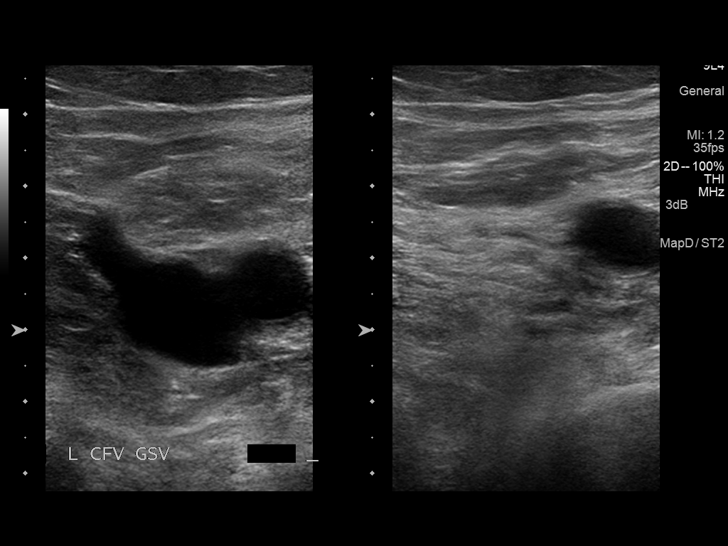
[im 8/31]
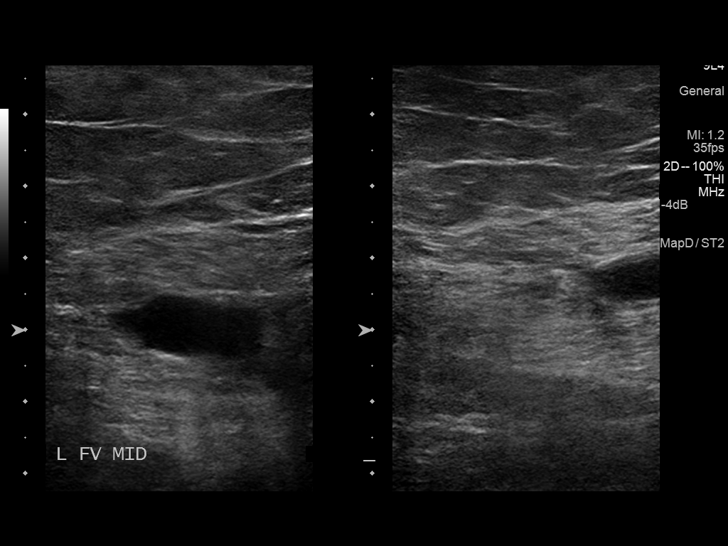
[im 11/31]
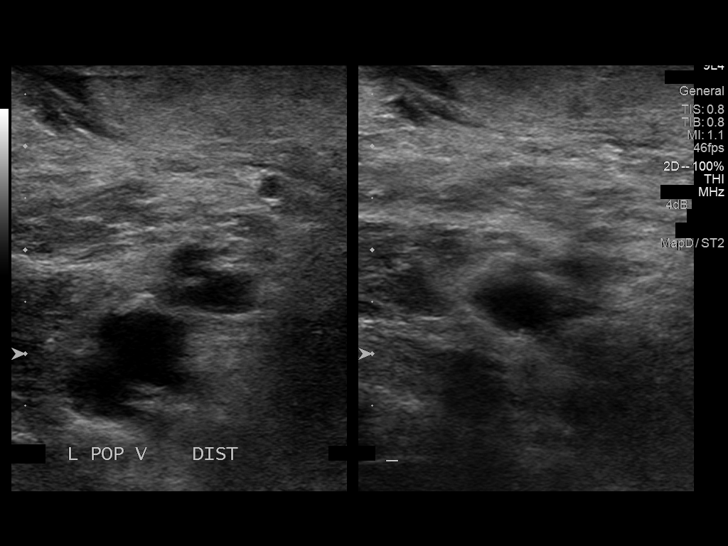
[im 14/31]
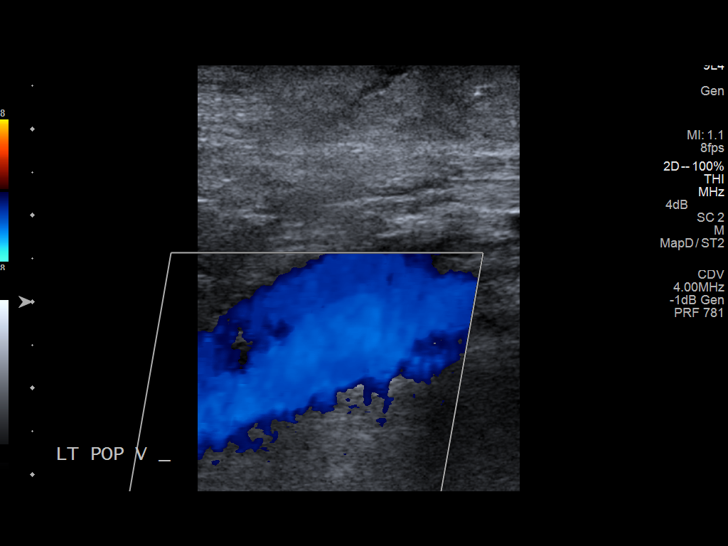
[im 16/31]
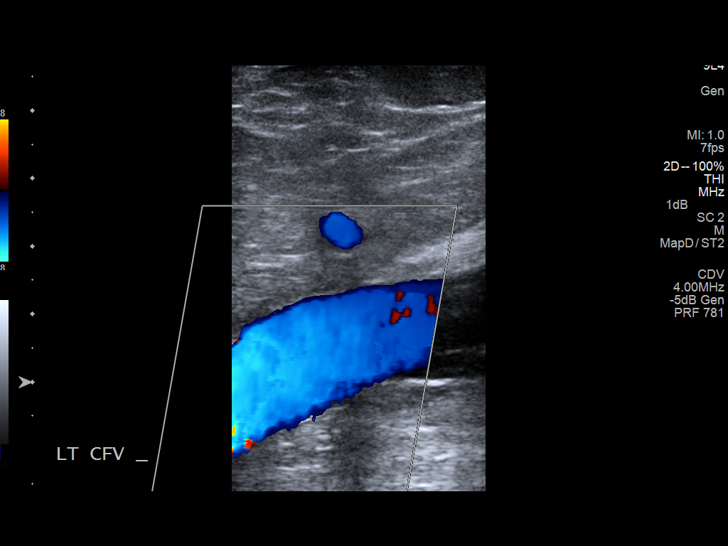
[im 17/31]
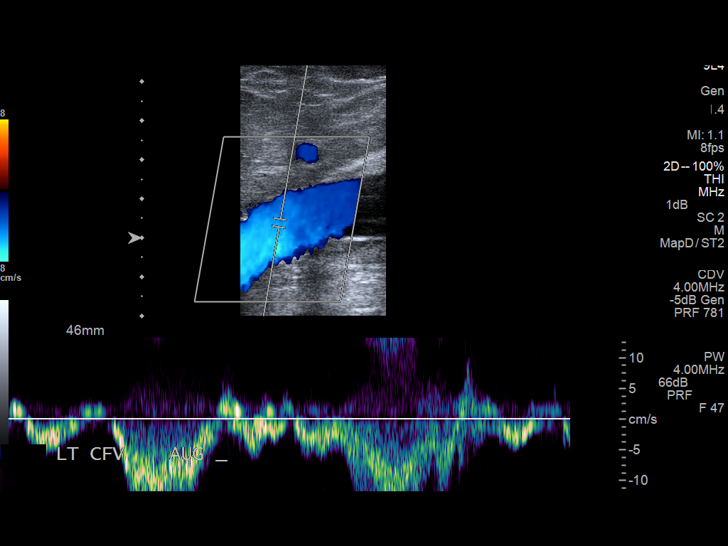
[im 20/31]
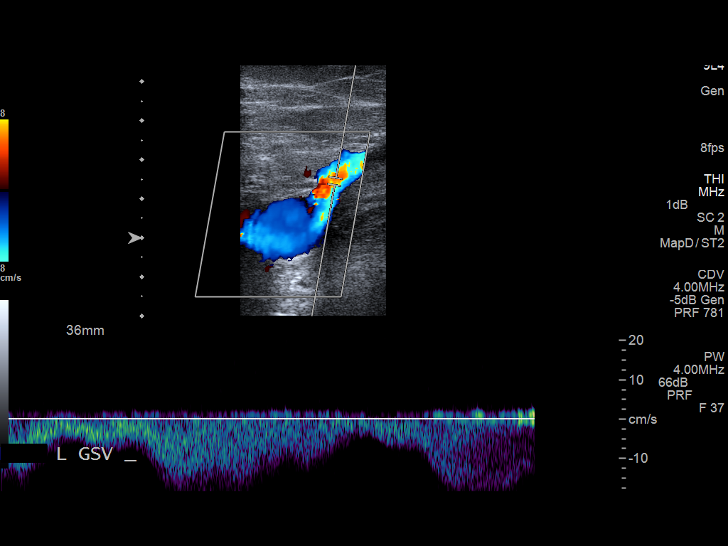
[im 23/31]
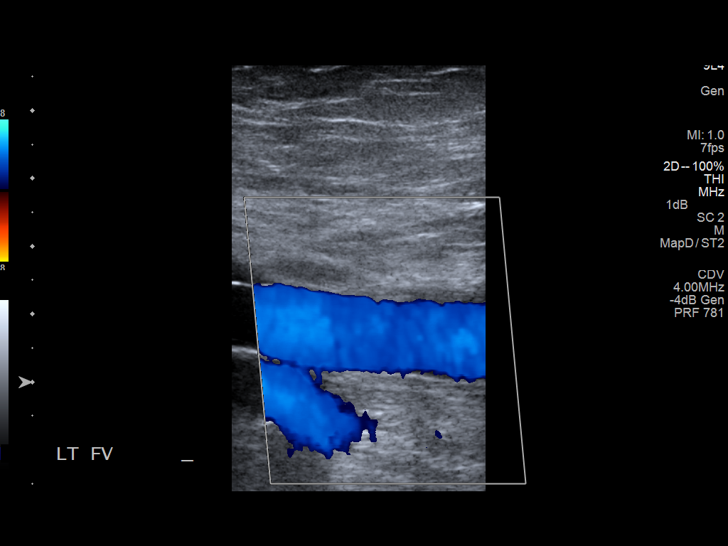
[im 25/31]
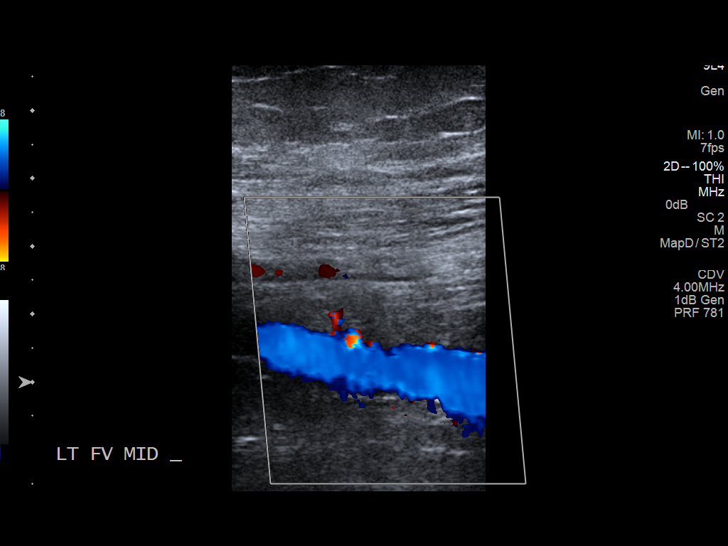
[im 28/31]
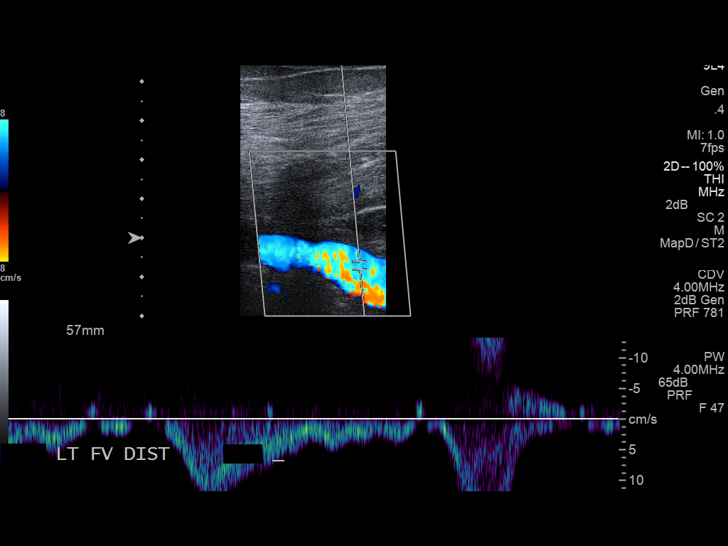
[im 31/31]
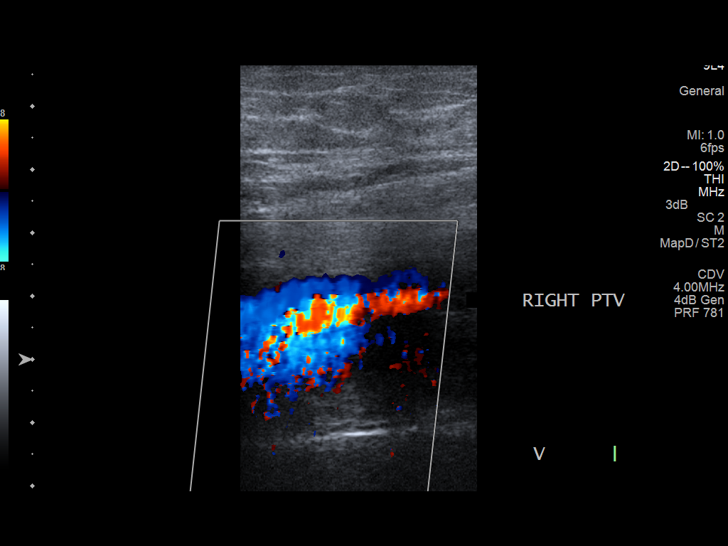

[13 of 24 positions shown; findings below may reference images not displayed]

FINDINGS: Contralateral Common Femoral Vein: Respiratory phasicity is normal
and symmetric with the symptomatic side. No evidence of thrombus.
Normal compressibility.

Common Femoral Vein: No evidence of thrombus. Normal
compressibility, respiratory phasicity and response to augmentation.

Saphenofemoral Junction: No evidence of thrombus. Normal
compressibility and flow on color Doppler imaging.

Profunda Femoral Vein: No evidence of thrombus. Normal
compressibility and flow on color Doppler imaging.

Femoral Vein: No evidence of thrombus. Normal compressibility,
respiratory phasicity and response to augmentation.

Popliteal Vein: No evidence of thrombus. Normal compressibility,
respiratory phasicity and response to augmentation.

Calf Veins: Not visualized secondary to overlying bandage along the
calf.

Superficial Great Saphenous Vein: No evidence of thrombus. Normal
compressibility and flow on color Doppler imaging.

Venous Reflux:  None.

Other Findings:  None.
IMPRESSION: No evidence of left lower extremity deep venous thrombosis.

## 2015-11-14 IMAGING — US US EXTREM LOW VENOUS*R*
1 series · 13 of 24 positions shown · non-contrast
Comparison: None.

CLINICAL DATA: 81-year-old male with right lower extremity pain and
swelling



[Series 1: us extrem low venous*right* · 0.09mm/px · 13 of 43 slices shown]
[im 1/43]
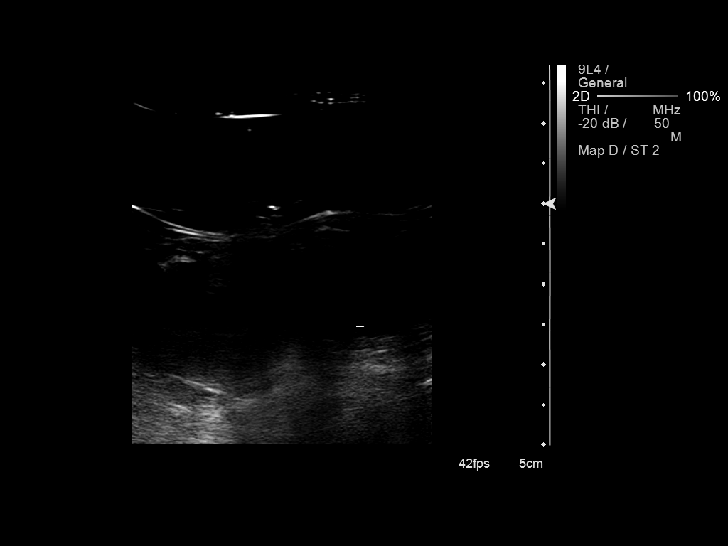
[im 4/43]
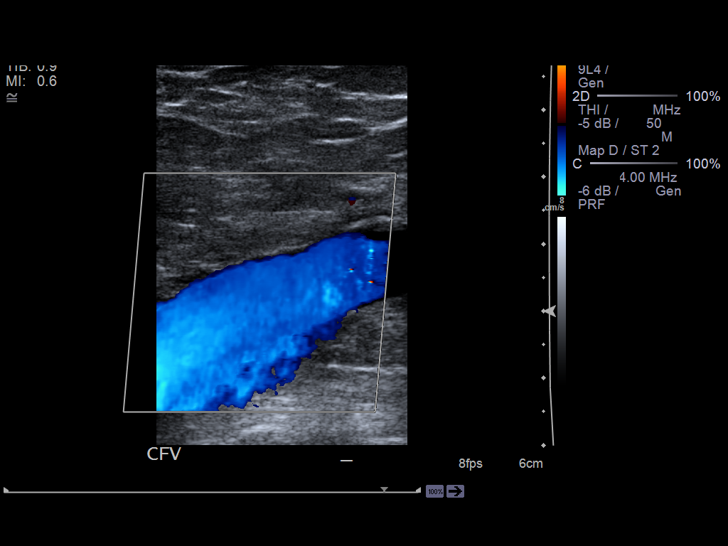
[im 8/43]
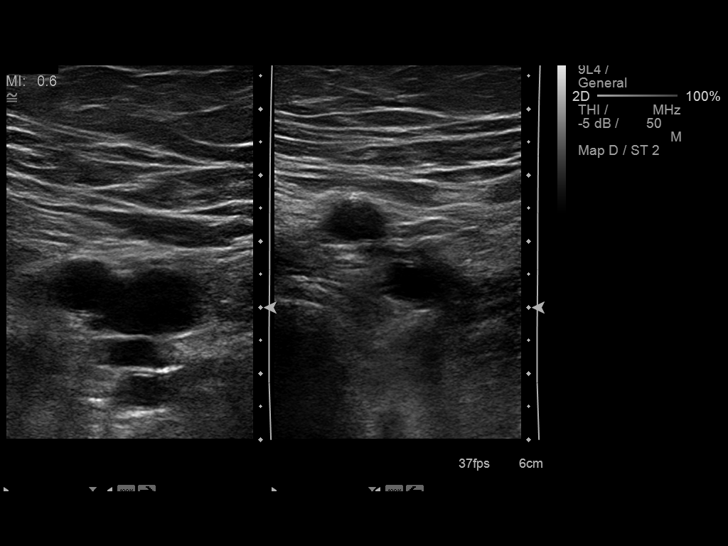
[im 11/43]
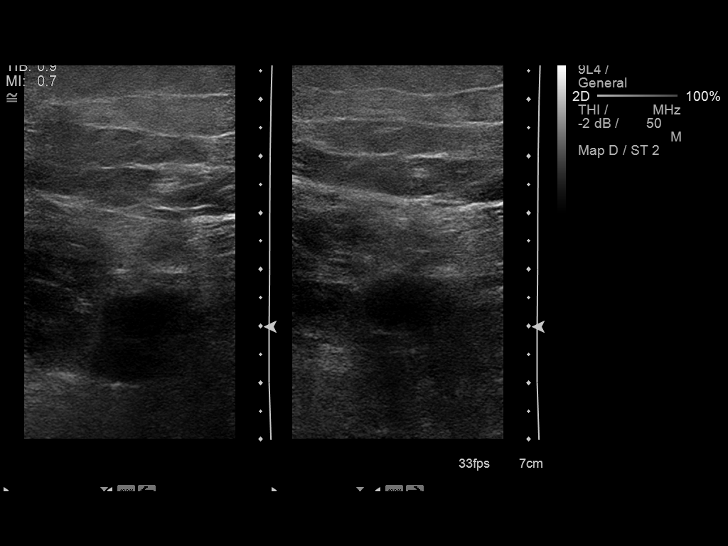
[im 15/43]
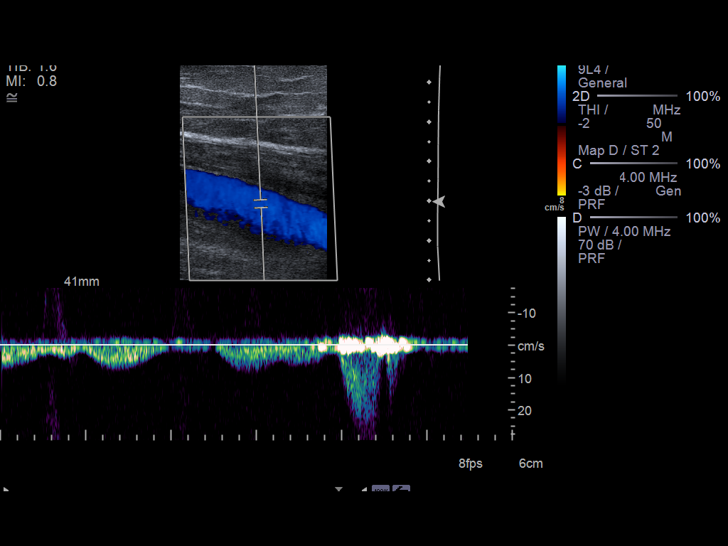
[im 19/43]
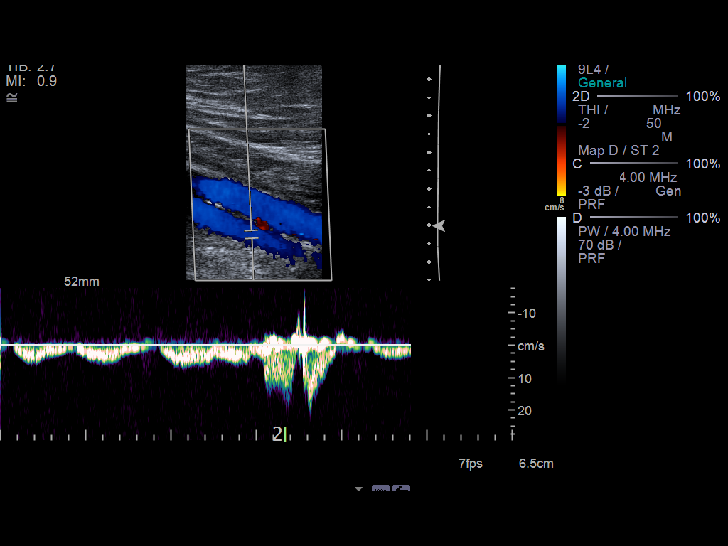
[im 22/43]
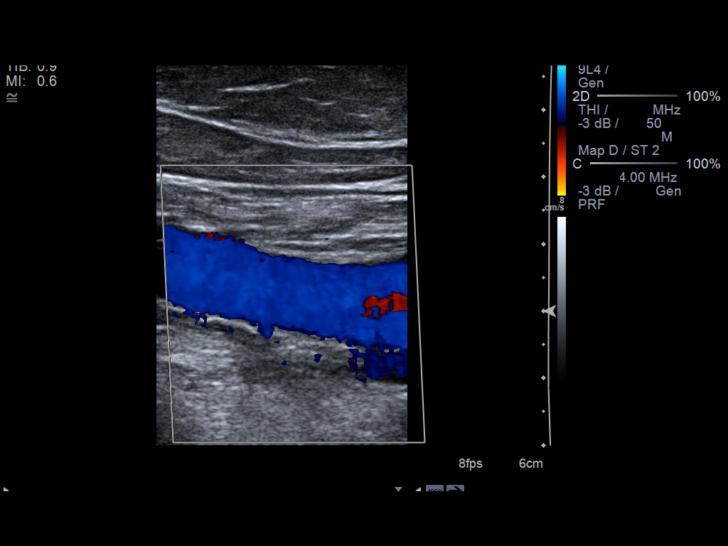
[im 24/43]
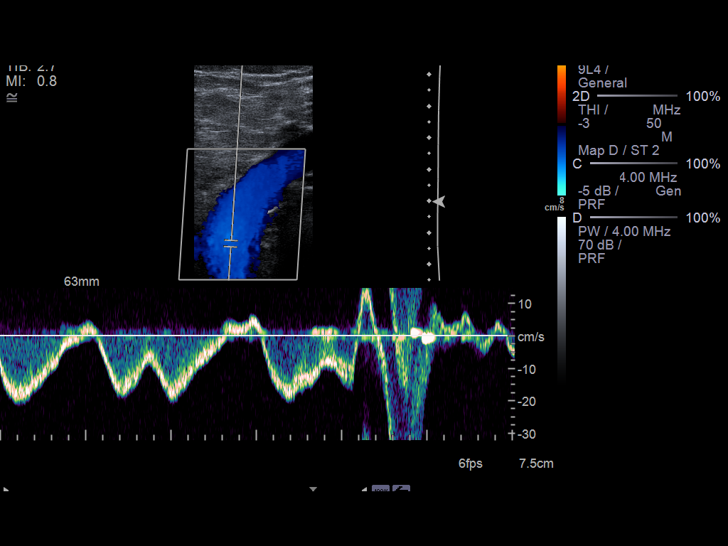
[im 28/43]
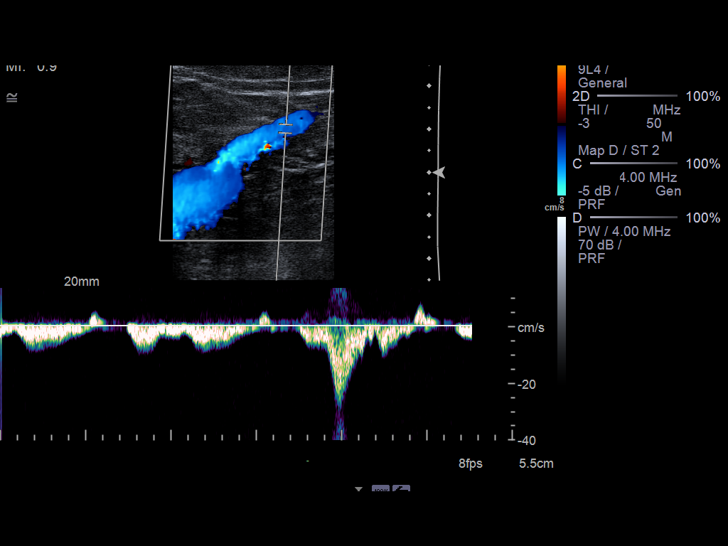
[im 32/43]
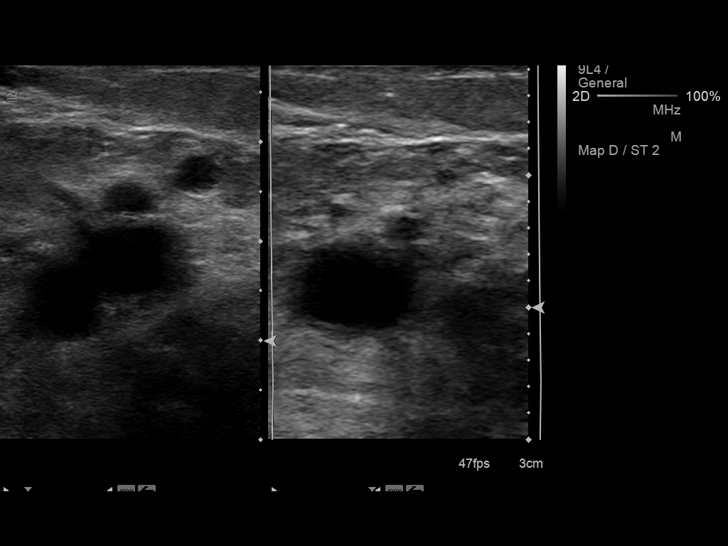
[im 35/43]
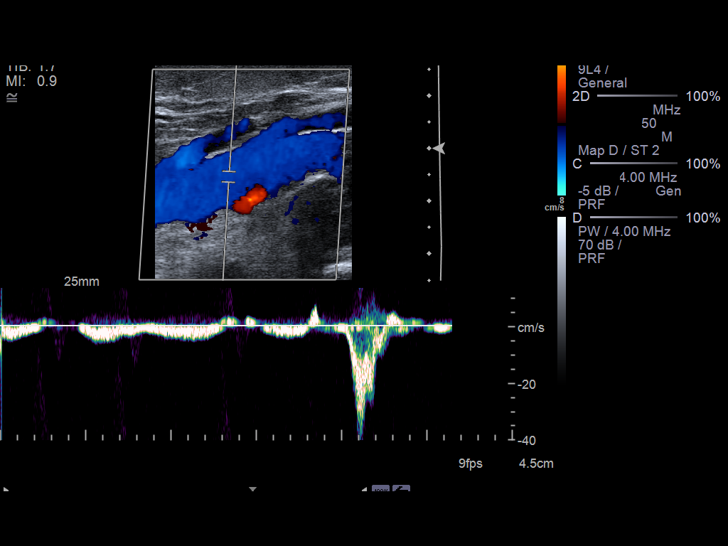
[im 39/43]
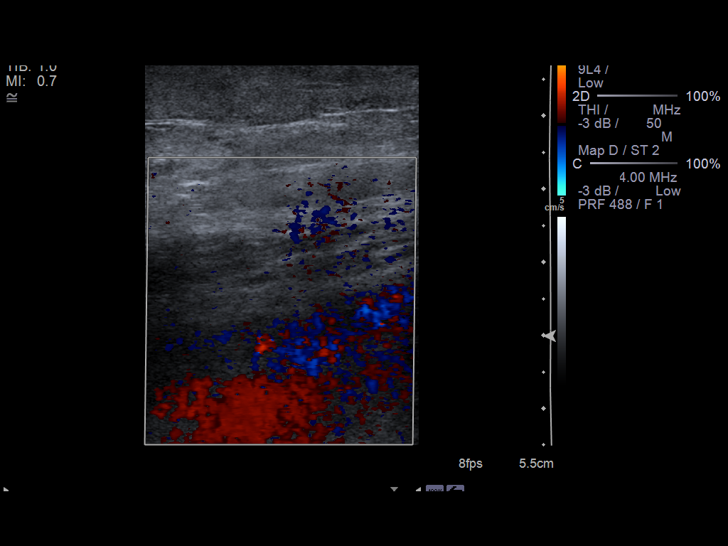
[im 43/43]
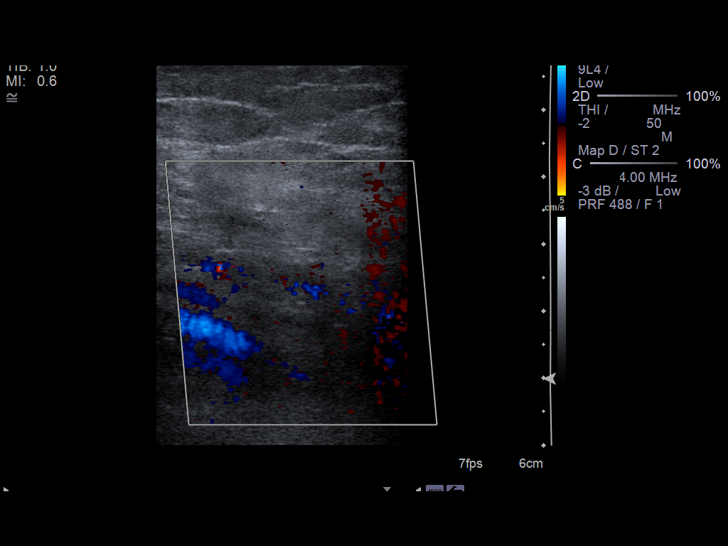

[13 of 24 positions shown; findings below may reference images not displayed]

FINDINGS: Contralateral Common Femoral Vein: Respiratory phasicity is normal
and symmetric with the symptomatic side. No evidence of thrombus.
Normal compressibility.

Common Femoral Vein: No evidence of thrombus. Normal
compressibility, respiratory phasicity and response to augmentation.

Saphenofemoral Junction: No evidence of thrombus. Normal
compressibility and flow on color Doppler imaging.

Profunda Femoral Vein: No evidence of thrombus. Normal
compressibility and flow on color Doppler imaging.

Femoral Vein: No evidence of thrombus. Normal compressibility,
respiratory phasicity and response to augmentation.

Popliteal Vein: No evidence of thrombus. Normal compressibility,
respiratory phasicity and response to augmentation.

Calf Veins: No evidence of thrombus. Normal compressibility and flow
on color Doppler imaging.

Superficial Great Saphenous Vein: No evidence of thrombus. Normal
compressibility and flow on color Doppler imaging.

Other Findings:  Superficial edema
IMPRESSION: Sonographic survey of the right lower extremity negative for DVT.

## 2017-04-13 ENCOUNTER — Ambulatory Visit
Admission: RE | Admit: 2017-04-13 | Discharge: 2017-04-13 | Disposition: A | Discharge status: Home / Self Care | Payer: Medicare Other | Source: Ambulatory Visit | Attending: Internal Medicine | Admitting: Internal Medicine

## 2017-04-13 ENCOUNTER — Other Ambulatory Visit: Payer: Self-pay | Admitting: Internal Medicine

## 2017-04-13 DIAGNOSIS — R600 Localized edema: Secondary | ICD-10-CM | POA: Diagnosis not present

## 2017-05-03 ENCOUNTER — Encounter: Payer: Medicare Other | Attending: Internal Medicine | Admitting: Internal Medicine

## 2017-05-03 DIAGNOSIS — L97211 Non-pressure chronic ulcer of right calf limited to breakdown of skin: Secondary | ICD-10-CM | POA: Insufficient documentation

## 2017-05-03 DIAGNOSIS — I87323 Chronic venous hypertension (idiopathic) with inflammation of bilateral lower extremity: Secondary | ICD-10-CM | POA: Insufficient documentation

## 2017-05-03 DIAGNOSIS — Z7901 Long term (current) use of anticoagulants: Secondary | ICD-10-CM | POA: Diagnosis not present

## 2017-05-03 DIAGNOSIS — I739 Peripheral vascular disease, unspecified: Secondary | ICD-10-CM | POA: Insufficient documentation

## 2017-05-03 DIAGNOSIS — I872 Venous insufficiency (chronic) (peripheral): Secondary | ICD-10-CM | POA: Diagnosis not present

## 2017-05-03 DIAGNOSIS — I10 Essential (primary) hypertension: Secondary | ICD-10-CM | POA: Diagnosis not present

## 2017-05-03 DIAGNOSIS — I890 Lymphedema, not elsewhere classified: Secondary | ICD-10-CM | POA: Diagnosis not present

## 2017-05-03 DIAGNOSIS — I4891 Unspecified atrial fibrillation: Secondary | ICD-10-CM | POA: Diagnosis not present

## 2017-05-03 DIAGNOSIS — J449 Chronic obstructive pulmonary disease, unspecified: Secondary | ICD-10-CM | POA: Diagnosis not present

## 2017-05-04 NOTE — Progress Notes (Signed)
Nathan Kirby, [name] ([id number]) Visit Report for 05/03/2017 Allergy List Details Patient Name: Nathan Kirby, [name] Date of Service: 05/03/2017 10:30 AM Medical Record Number: [id number] Patient Account Number: [id number] Date of Birth/Sex: 11/21/1933 [age] y.o. Male) Treating RN: Phillis HaggisPinkerton, Debi Primary Care Perry Brucato: Daniel NonesKLEIN, [name] Other Clinician: Referring Laquinton Bihm: Daniel NonesKLEIN, [name] Treating Roylene Heaton/Extender: Maxwell CaulOBSON, [name] G Weeks in Treatment: 0 Allergies Active Allergies iodine shellfish derived Allergy Notes Electronic Signature(s) Signed: 05/03/2017 5:03:58 PM By: Alejandro MullingPinkerton, Debra Entered By: Alejandro MullingPinkerton, Debra on 05/03/2017 10:32:57 Nathan Kirby, [name] ([id number]) -------------------------------------------------------------------------------- Arrival Information Details Patient Name: Nathan Kirby, [name] Date of Service: 05/03/2017 10:30 AM Medical Record Number: [id number] Patient Account Number: [id number] Date of Birth/Sex: 11/21/1933 [age] y.o. Male) Treating RN: Phillis HaggisPinkerton, Debi Primary Care Cantrell Larouche: Daniel NonesKLEIN, [name] Other Clinician: Referring Eldra Word: Daniel NonesKLEIN, [name] Treating Teyla Skidgel/Extender: Altamese CarolinaOBSON, [name] G Weeks in Treatment: 0 Visit Information Patient Arrived: Cane Arrival Time: 10:29 Accompanied By: wife Transfer Assistance: None Patient Identification Verified: Yes Secondary Verification Process Yes Completed: Patient Requires Transmission- No Based Precautions: Patient Has Alerts: Yes Patient Alerts: Patient on Blood Thinner Coumadin L ABI non- compressible R ABI non- compressible History Since Last Visit All ordered tests and consults were completed: No Added or deleted any medications: No Any new allergies or adverse reactions: No Had a fall or experienced change in activities of daily living that may affect risk of falls: No Signs or symptoms of abuse/neglect since last visito No Hospitalized since last visit: No Electronic Signature(s) Signed: 05/03/2017  5:03:58 PM By: Alejandro MullingPinkerton, Debra Entered By: Alejandro MullingPinkerton, Debra on 05/03/2017 11:18:45 Nathan Kirby, [name] ([id number]) -------------------------------------------------------------------------------- Clinic Level of Care Assessment Details Patient Name: Nathan Kirby, [name] Date of Service: 05/03/2017 10:30 AM Medical Record Number: [id number] Patient Account Number: [id number] Date of Birth/Sex: 11/21/1933 [age] y.o. Male) Treating RN: Phillis HaggisPinkerton, Debi Primary Care Dvontae Ruan: Daniel NonesKLEIN, [name] Other Clinician: Referring Xzander Gilham: Daniel NonesKLEIN, [name] Treating Oaklan Persons/Extender: Maxwell CaulOBSON, [name] G Weeks in Treatment: 0 Clinic Level of Care Assessment Items TOOL 2 Quantity Score X - Use when only an EandM is performed on the INITIAL visit 1 0 ASSESSMENTS - Nursing Assessment / Reassessment X - General Physical Exam (combine w/ comprehensive assessment (listed just 1 20 below) when performed on new pt. evals) X - Comprehensive Assessment (HX, ROS, Risk Assessments, Wounds Hx, etc.) 1 25 ASSESSMENTS - Wound and Skin Assessment / Reassessment []  - Simple Wound Assessment / Reassessment - one wound 0 []  - Complex Wound Assessment / Reassessment - multiple wounds 0 []  - Dermatologic / Skin Assessment (not related to wound area) 0 ASSESSMENTS - Ostomy and/or Continence Assessment and Care []  - Incontinence Assessment and Management 0 []  - Ostomy Care Assessment and Management (repouching, etc.) 0 PROCESS - Coordination of Care []  - Simple Patient / Family Education for ongoing care 0 X - Complex (extensive) Patient / Family Education for ongoing care 1 20 X - Staff obtains ChiropractorConsents, Records, Test Results / Process Orders 1 10 []  - Staff telephones HHA, Nursing Homes / Clarify orders / etc 0 []  - Routine Transfer to another Facility (non-emergent condition) 0 []  - Routine Hospital Admission (non-emergent condition) 0 X - New Admissions / Manufacturing engineernsurance Authorizations / Ordering NPWT, Apligraf, etc. 1 15 []  - Emergency  Hospital Admission (emergent condition) 0 X - Simple Discharge Coordination 1 10 Nathan Kirby, [name] ([id number]) []  - Complex (extensive) Discharge Coordination 0 PROCESS - Special Needs []  - Pediatric / Minor Patient Management 0 []  - Isolation Patient Management 0 []  - Hearing / Language / Visual special needs 0 []  -  Assessment of Community assistance (transportation, D/C planning, etc.) 0 []  - Additional assistance / Altered mentation 0 []  - Support Surface(s) Assessment (bed, cushion, seat, etc.) 0 INTERVENTIONS - Wound Cleansing / Measurement X - Wound Imaging (photographs - any number of wounds) 1 5 []  - Wound Tracing (instead of photographs) 0 []  - Simple Wound Measurement - one wound 0 []  - Complex Wound Measurement - multiple wounds 0 []  - Simple Wound Cleansing - one wound 0 []  - Complex Wound Cleansing - multiple wounds 0 INTERVENTIONS - Wound Dressings []  - Small Wound Dressing one or multiple wounds 0 []  - Medium Wound Dressing one or multiple wounds 0 []  - Large Wound Dressing one or multiple wounds 0 []  - Application of Medications - injection 0 INTERVENTIONS - Miscellaneous []  - External ear exam 0 []  - Specimen Collection (cultures, biopsies, blood, body fluids, etc.) 0 []  - Specimen(s) / Culture(s) sent or taken to Lab for analysis 0 []  - Patient Transfer (multiple staff / Nurse, adult / Similar devices) 0 []  - Simple Staple / Suture removal (25 or less) 0 []  - Complex Staple / Suture removal (26 or more) 0 Jerger, Jasun C. ([id number]) []  - Hypo / Hyperglycemic Management (close monitor of Blood Glucose) 0 X - Ankle / Brachial Index (ABI) - do not check if billed separately 1 15 Has the patient been seen at the hospital within the last three years: Yes Total Score: 120 Level Of Care: New/Established - Level 4 Electronic Signature(s) Signed: 05/03/2017 5:03:58 PM By: Alejandro Mulling Entered By: Alejandro Mulling on 05/03/2017 13:07:01 Nathan Kirby  ([id number]) -------------------------------------------------------------------------------- Encounter Discharge Information Details Patient Name: Nathan Kirby. Date of Service: 05/03/2017 10:30 AM Medical Record Number: [id number] Patient Account Number: [id number] Date of Birth/Sex: 1933/11/12 (83 y.o. Male) Treating RN: Phillis Haggis Primary Care Arish Redner: Daniel Nones Other Clinician: Referring Natahlia Hoggard: Daniel Nones Treating Sarah Zerby/Extender: Altamese Dubois in Treatment: 0 Encounter Discharge Information Items Discharge Pain Level: 0 Discharge Condition: Stable Ambulatory Status: Cane Discharge Destination: Home Transportation: Private Auto Accompanied By: wife Schedule Follow-up Appointment: Yes Medication Reconciliation completed No and provided to Patient/Care Preslynn Bier: Provided on Clinical Summary of Care: 05/03/2017 Form Type Recipient Paper Patient LW Electronic Signature(s) Signed: 05/03/2017 12:07:59 PM By: Francie Massing Entered By: Francie Massing on 05/03/2017 12:07:59 Nathan Kirby ([id number]) -------------------------------------------------------------------------------- Lower Extremity Assessment Details Patient Name: Nathan Kirby. Date of Service: 05/03/2017 10:30 AM Medical Record Number: [id number] Patient Account Number: [id number] Date of Birth/Sex: 25-Feb-1933 (83 y.o. Male) Treating RN: Phillis Haggis Primary Care Milo Solana: Daniel Nones Other Clinician: Referring Lavetta Geier: Daniel Nones Treating Kamden Reber/Extender: Maxwell Caul Weeks in Treatment: 0 Edema Assessment Assessed: [Left: No] [Right: No] Edema: [Left: Yes] [Right: Yes] Calf Left: Right: Point of Measurement: 37 cm From Medial Instep 49.4 cm 51.6 cm Ankle Left: Right: Point of Measurement: 10 cm From Medial Instep 33.8 cm 34 cm Vascular Assessment Pulses: Dorsalis Pedis Palpable: [Left:No] [Right:No] Posterior Tibial Extremity colors, hair growth, and  conditions: Extremity Color: [Left:Red] [Right:Red] Temperature of Extremity: [Left:Warm] [Right:Warm] Capillary Refill: [Left:< 3 seconds] [Right:< 3 seconds] Toe Nail Assessment Left: Right: Thick: Yes Yes Discolored: Yes Yes Deformed: Yes Yes Improper Length and Hygiene: Yes Yes Notes L ABI non-compressible R ABI non-compressible Electronic Signature(s) Signed: 05/03/2017 5:03:58 PM By: Alejandro Mulling Entered By: Alejandro Mulling on 05/03/2017 11:18:26 Nathan Kirby ([id number]) Dan Humphreys, Page Spiro ([id number]) -------------------------------------------------------------------------------- Multi Wound Chart Details Patient Name: Nathan Kirby Date of Service: 05/03/2017 10:30 AM Medical Record  Number: [id number] Patient Account Number: [id number] Date of Birth/Sex: September 14, 1933 (83 y.o. Male) Treating RN: Phillis Haggis Primary Care Raedyn Wenke: Daniel Nones Other Clinician: Referring Yarithza Mink: Daniel Nones Treating Kima Malenfant/Extender: Maxwell Caul Weeks in Treatment: 0 Vital Signs Height(in): 68 Pulse(bpm): 88 Weight(lbs): 208 Blood Pressure 142/86 (mmHg): Body Mass Index(BMI): 32 Temperature(F): 97.6 Respiratory Rate 18 (breaths/min): Wound Assessments Treatment Notes Electronic Signature(s) Signed: 05/03/2017 5:27:39 PM By: [name] MD Entered By: [name] on 05/03/2017 12:20:07 Nathan Kirby ([id number]) -------------------------------------------------------------------------------- Multi-Disciplinary Care Plan Details Patient Name: Nathan Kirby, NARINE. Date of Service: 05/03/2017 10:30 AM Medical Record Number: [id number] Patient Account Number: [id number] Date of Birth/Sex: Apr 06, 1933 (83 y.o. Male) Treating RN: Phillis Haggis Primary Care Emillio Ngo: Daniel Nones Other Clinician: Referring Autumn Gunn: Daniel Nones Treating Daviona Herbert/Extender: Maxwell Caul Weeks in Treatment: 0 Active Inactive ` Nutrition Nursing  Diagnoses: Imbalanced nutrition Potential for alteratiion in Nutrition/Potential for imbalanced nutrition Goals: Patient/caregiver agrees to and verbalizes understanding of need to use nutritional supplements and/or vitamins as prescribed Date Initiated: 05/03/2017 Target Resolution Date: 08/06/2017 Goal Status: Active Interventions: Assess patient nutrition upon admission and as needed per policy Notes: ` Orientation to the Wound Care Program Nursing Diagnoses: Knowledge deficit related to the wound healing center program Goals: Patient/caregiver will verbalize understanding of the Wound Healing Center Program Date Initiated: 05/03/2017 Target Resolution Date: 06/04/2017 Goal Status: Active Interventions: Provide education on orientation to the wound center Notes: ` Wound/Skin Impairment Nursing Diagnoses: NAYQUAN, EVINGER ([id number]) Impaired tissue integrity Goals: Ulcer/skin breakdown will have a volume reduction of 80% by week 12 Date Initiated: 05/03/2017 Target Resolution Date: 01/07/2018 Goal Status: Active Interventions: Assess patient/caregiver ability to perform ulcer/skin care regimen upon admission and as needed Notes: Electronic Signature(s) Signed: 05/03/2017 5:03:58 PM By: Alejandro Mulling Entered By: Alejandro Mulling on 05/03/2017 11:57:47 Nathan Kirby ([id number]) -------------------------------------------------------------------------------- Pain Assessment Details Patient Name: Nathan Kirby Date of Service: 05/03/2017 10:30 AM Medical Record Number: [id number] Patient Account Number: [id number] Date of Birth/Sex: 1933/01/04 (83 y.o. Male) Treating RN: Phillis Haggis Primary Care Sheneka Schrom: Daniel Nones Other Clinician: Referring Manases Etchison: Daniel Nones Treating Yves Fodor/Extender: Maxwell Caul Weeks in Treatment: 0 Active Problems Location of Pain Severity and Description of Pain Patient Has Paino No Site Locations With Dressing Change:  No Pain Management and Medication Current Pain Management: Electronic Signature(s) Signed: 05/03/2017 5:03:58 PM By: Alejandro Mulling Entered By: Alejandro Mulling on 05/03/2017 10:31:01 Nathan Kirby ([id number]) -------------------------------------------------------------------------------- Vitals Details Patient Name: Nathan Kirby. Date of Service: 05/03/2017 10:30 AM Medical Record Number: [id number] Patient Account Number: [id number] Date of Birth/Sex: [birth year] (83 y.o. Male) Treating RN: Phillis Haggis Primary Care Alekxander Isola: Daniel Nones Other Clinician: Referring Lasaro Primm: Daniel Nones Treating Fergie Sherbert/Extender: Maxwell Caul Weeks in Treatment: 0 Vital Signs Time Taken: 10:31 Temperature (F): 97.6 Height (in): 68 Pulse (bpm): 88 Source: Stated Respiratory Rate (breaths/min): 18 Weight (lbs): 208 Blood Pressure (mmHg): 142/86 Source: Measured Reference Range: 80 - 120 mg / dl Body Mass Index (BMI): 31.6 Electronic Signature(s) Signed: 05/03/2017 5:03:58 PM By: Alejandro Mulling Entered By: Alejandro Mulling on 05/03/2017 10:33:49

## 2017-05-04 NOTE — Progress Notes (Signed)
Nathan Kirby, Chee C. ([id number]) Visit Report for 05/03/2017 Abuse/Suicide Risk Screen Details Nathan Kirby, Parag Date of Service: 05/03/2017 10:30 AM Patient Name: C. Patient Account Number: [id number] Medical Record Treating RN: Phillis Haggisinkerton, Debi [id number] Number: Other Clinician: Date of Birth/Sex: 10/10/1933 (83 y.o. Male) Treating ROBSON, [name] Primary Care Terry Bolotin: Daniel NonesKLEIN, [name] Rakisha Pincock/Extender: G Referring Eyob Godlewski: Daniel NonesKLEIN, [name] Weeks in Treatment: 0 Abuse/Suicide Risk Screen Items Answer ABUSE/SUICIDE RISK SCREEN: Has anyone close to you tried to hurt or harm you recentlyo No Do you feel uncomfortable with anyone in your familyo No Has anyone forced you do things that you didnot want to doo No Do you have any thoughts of harming yourselfo No Patient displays signs or symptoms of abuse and/or neglect. No Electronic Signature(s) Signed: 05/03/2017 5:03:58 PM By: Alejandro MullingPinkerton, Debra Entered By: Alejandro MullingPinkerton, Debra on 05/03/2017 10:44:29 Nathan Kirby, [name] ([id number]) -------------------------------------------------------------------------------- Activities of Daily Living Details Nathan Kirby, [name] Date of Service: 05/03/2017 10:30 AM Patient Name: C. Patient Account Number: [id number] Medical Record Treating RN: Phillis Haggisinkerton, Debi [id number] Number: Other Clinician: Date of Birth/Sex: 10/10/1933 (83 y.o. Male) Treating [name], [name] Primary Care Javonna Balli: Daniel NonesKLEIN, [name] Kenyon Eshleman/Extender: G Referring Lindberg Zenon: [name], [name] Weeks in Treatment: 0 Activities of Daily Living Items Answer Activities of Daily Living (Please select one for each item) Drive Automobile Completely Able Take Medications Completely Able Use Telephone Completely Able Care for Appearance Completely Able Use Toilet Completely Able Bath / Shower Completely Able Dress Self Completely Able Feed Self Completely Able Walk Need Assistance Get In / Out Bed Need Assistance Housework Need Assistance Prepare Meals Need  Assistance Handle Money Completely Able Shop for Self Completely Able Electronic Signature(s) Signed: 05/03/2017 5:03:58 PM By: Alejandro MullingPinkerton, Debra Entered By: Alejandro MullingPinkerton, Debra on 05/03/2017 11:03:04 Nathan Kirby, [name] ([id number]) -------------------------------------------------------------------------------- Education Assessment Details Nathan Kirby, Nathan Kirby Date of Service: 05/03/2017 10:30 AM Patient Name: C. Patient Account Number: [id number] Medical Record Treating RN: Phillis Haggisinkerton, Debi [id number] Number: Other Clinician: Date of Birth/Sex: 10/10/1933 (83 y.o. Male) Treating [name], [name] Primary Care Yazir Koerber: Daniel NonesKLEIN, [name] Brittnae Aschenbrenner/Extender: G Referring Samuele Storey: [name], [name] Weeks in Treatment: 0 Primary Learner Assessed: Patient Learning Preferences/Education Level/Primary Language Learning Preference: Explanation, Printed Material Highest Education Level: College or Above Preferred Language: English Cognitive Barrier Assessment/Beliefs Language Barrier: No Translator Needed: No Memory Deficit: No Emotional Barrier: No Cultural/Religious Beliefs Affecting Medical No Care: Physical Barrier Assessment Impaired Vision: Yes Glasses Impaired Hearing: No Decreased Hand dexterity: No Knowledge/Comprehension Assessment Knowledge Level: High Comprehension Level: High Ability to understand written High instructions: Ability to understand verbal High instructions: Motivation Assessment Anxiety Level: Calm Cooperation: Cooperative Education Importance: Acknowledges Need Interest in Health Problems: Asks Questions Perception: Coherent Willingness to Engage in Self- High Management Activities: Readiness to Engage in Self- High Management Activities: Nathan Kirby, [name] ([id number]) Electronic Signature(s) Signed: 05/03/2017 5:03:58 PM By: Alejandro MullingPinkerton, Debra Entered By: Alejandro MullingPinkerton, Debra on 05/03/2017 11:03:27 Nathan Kirby, [name]  ([id number]) -------------------------------------------------------------------------------- Fall Risk Assessment Details Nathan Kirby, Nathan Kirby Date of Service: 05/03/2017 10:30 AM Patient Name: C. Patient Account Number: [id number] Medical Record Treating RN: Phillis Haggisinkerton, Debi [id number] Number: Other Clinician: Date of Birth/Sex: 10/10/1933 (83 y.o. Male) Treating ROBSON, [name] Primary Care Hildur Bayer: Daniel NonesKLEIN, [name] Kamerin Grumbine/Extender: G Referring Pietra Zuluaga: [name], [name] Weeks in Treatment: 0 Fall Risk Assessment Items Have you had 2 or more falls in the last 12 monthso 0 No Have you had any fall that resulted in injury in the last 12 monthso 0 No FALL RISK ASSESSMENT: History of falling - immediate or within 3 months 0 No Secondary diagnosis 0 No Ambulatory aid None/bed rest/wheelchair/nurse  0 No Crutches/cane/[place] 15 Yes Furniture 0 No IV Access/Saline Lock 0 No Gait/Training Normal/bed rest/immobile 0 No Weak 0 No Impaired 0 No Mental Status Oriented to own ability 0 Yes Electronic Signature(s) Signed: 05/03/2017 5:03:58 PM By: Alejandro Mulling Entered By: Alejandro Mulling on 05/03/2017 11:04:22 Nathan Frees ([id number]) -------------------------------------------------------------------------------- Foot Assessment Details Nathan Dew Date of Service: 05/03/2017 10:30 AM Patient Name: C. Patient Account Number: [id number] Medical Record Treating RN: Phillis Haggis [id number] Number: Other Clinician: Date of Birth/Sex: 08/29/1933 (83 y.o. Male) Treating [name] Primary Care Yeimi Debnam: Daniel Nones Ismaeel Arvelo/Extender: G Referring Annissa Andreoni: Daniel Nones Weeks in Treatment: 0 Foot Assessment Items Site Locations + = Sensation present, - = Sensation absent, C = Callus, U = Ulcer R = Redness, W = Warmth, M = Maceration, PU = Pre-ulcerative lesion F = Fissure, S = Swelling, D = Dryness Assessment Right: Left: Other Deformity: No No Prior Foot Ulcer: No No Prior  Amputation: No No Charcot Joint: No No Ambulatory Status: Ambulatory With Help Assistance Device: Cane Gait: Steady Electronic Signature(s) Signed: 05/03/2017 5:03:58 PM By: Alejandro Mulling Entered By: Alejandro Mulling on 05/03/2017 11:11:10 Nathan Frees ([id number]) Dan Humphreys, Page Spiro ([id number]) -------------------------------------------------------------------------------- Nutrition Risk Assessment Details Nathan Dew Date of Service: 05/03/2017 10:30 AM Patient Name: C. Patient Account Number: [id number] Medical Record Treating RN: Phillis Haggis [id number] Number: Other Clinician: Date of Birth/Sex: Nov 17, 1933 (83 y.o. Male) Treating ROBSON, [name] Primary Care Emika Tiano: Daniel Nones Jairen Goldfarb/Extender: G Referring Ardith Lewman: Daniel Nones Weeks in Treatment: 0 Height (in): 68 Weight (lbs): 208 Body Mass Index (BMI): 31.6 Nutrition Risk Assessment Items NUTRITION RISK SCREEN: I have an illness or condition that made me change the kind and/or 2 Yes amount of food I eat I eat fewer than two meals per day 3 Yes I eat few fruits and vegetables, or milk products 0 No I have three or more drinks of beer, liquor or wine almost every day 0 No I have tooth or mouth problems that make it hard for me to eat 0 No I don't always have enough money to buy the food I need 0 No I eat alone most of the time 0 No I take three or more different prescribed or over-the-counter drugs a 1 Yes day Without wanting to, I have lost or gained 10 pounds in the last six 0 No months I am not always physically able to shop, cook and/or feed myself 0 No Nutrition Protocols Good Risk Protocol Moderate Risk Protocol Electronic Signature(s) Signed: 05/03/2017 5:03:58 PM By: Alejandro Mulling Entered By: Alejandro Mulling on 05/03/2017 11:04:35

## 2017-05-04 NOTE — Progress Notes (Signed)
JARQUEZ, MESTRE ([id number]) Visit Report for 05/03/2017 Chief Complaint Document Details Nathan Kirby Date of Service: 05/03/2017 10:30 AM Patient Name: C. Patient Account Number: [id number] Medical Record Treating RN: Phillis Haggis [id number] Number: Other Clinician: Date of Birth/Sex: [birth year] (83 y.o. Male) Treating [name] Primary Care Provider: Daniel Nones Provider/Extender: G Referring Provider: Daniel Nones Weeks in Treatment: 0 Information Obtained from: Patient Chief Complaint Left traumatic calf ulcer/hematoma since mid Nov 2015. Electronic Signature(s) Signed: 05/03/2017 5:27:39 PM By: [name] MD Entered By: [name] on 05/03/2017 12:20:19 Shirline Frees ([id number]) -------------------------------------------------------------------------------- HPI Details Nathan Kirby Date of Service: 05/03/2017 10:30 AM Patient Name: C. Patient Account Number: [id number] Medical Record Treating RN: Phillis Haggis [id number] Number: Other Clinician: Date of Birth/Sex: 1933/01/28 (83 y.o. Male) Treating [name] Primary Care Provider: Daniel Nones Provider/Extender: G Referring Provider: Dorthula Nettles in Treatment: 0 History of Present Illness HPI Description: Very pleasant 83 year old with past medical history of atrial fibrillation (on Coumadin) and lymphedema. No history of diabetes or peripheral vascular disease. He fell on a rock in mid November 2015 and suffered a laceration and hematoma on his left anterior calf. He underwent suture repair in the emergency room. He subsequently developed an infected hematoma, was hospitalized, and underwent incision, drainage, and debridement in the operating room on 10/28/2014 and 10/30/2014 by Dr. Renda Rolls. Cultures grew coag-negative staph. He was discharged on 11/04/2014 on doxycycline and Augmentin, which he has completed. Initially presented to [facility] in Dec 2015. No significant pain. No  rest pain or claudication. ABI not obtained secondary to hematoma. Normal on the contralateral leg. Ambulating per his baseline with a Goel. Managed with regular debridements, silver/collagen dressing changes, and compression garments He returns to clinic for follow-up and is without complaints. No pain. No drainage. No fever or chills. 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 In spite of this he doesn't seem to of had a really impressive wound history. As noted above he was seen here in 2016 with a traumatic wound and secondary hematoma on the left anterior leg. He is also had a basal cell carcinoma removed on the right leg by his dermatologist Dr. Earlean Polka. He uses 30-40 mm compression stockings and is compliant with these. He tells me he is very careful. Several weeks ago he developed increasing pain and swelling in the right greater than left leg he was seen by his primary physician and I think treated with parenteral antibiotics and then oral antibiotics some combination of Doxy and Cipro. He was felt to have cellulitis of the right leg. A duplex ultrasound of the right leg was apparently negative for DVT although I cannot pull this up in terms of her report. Primary physician's notes on 5/21 suggest a shallow ulcer on the right leg. The patient states he didn't really have an ulcer he was just weeping edema. He does take Lasix 40 mg daily when necessary patient has noncompressible ABIs bilaterally in this clinic. I don't see any formal arterial studies. He has a history of atrial fibrillation on Coumadin he is not a diabetic the patient was seen here in this clinic in 2016 however he was not seen by any of the existing physician's or providers. Electronic Signature(s) Signed: 05/03/2017 5:27:39 PM By: [name] MD Entered By: [name] on 05/03/2017  12:37:45 Shirline Frees ([id number]) TRAVARES, NELLES ([id number]) -------------------------------------------------------------------------------- Physical Exam  Details Nathan Kirby Date of Service: 05/03/2017 10:30 AM Patient Name: C. Patient Account Number: [id number] Medical Record Treating RN: Phillis Haggis [id number] Number: Other Clinician: Date of Birth/Sex: 1933/04/30 (83 y.o. Male) Treating [name] Primary Care Provider: Daniel Nones Provider/Extender: G Referring Provider: Daniel Nones Weeks in Treatment: 0 Constitutional Sitting or standing Blood Pressure is within target range for patient.. Pulse regular and within target range for patient.Marland Kitchen Respirations regular, non-labored and within target range.. Temperature is normal and within the target range for the patient.Marland Kitchen appears in no distress. Eyes Conjunctivae clear. No discharge. Respiratory Respiratory effort is easy and symmetric bilaterally. Rate is normal at rest and on room air.. Bilateral breath sounds are clear and equal in all lobes with no wheezes, rales or rhonchi.. Cardiovascular Irregular pulse no murmurs JVP is not elevated. In spite of the edema in his dorsalis pedis pulses are robust. Capillary refill time is normal. Edema present in both extremities. Severe right leg edema with severe inflammation right greater than left.. Gastrointestinal (GI) Abdomen is soft and non-distended without masses or tenderness. Bowel sounds active in all quadrants.. Lymphatic None palpable in the popliteal or inguinal area.. Integumentary (Hair, Skin) There is erythema in both lower legs but no tenderness. There is no open wound the right leg is carefully inspected. Psychiatric No evidence of depression, anxiety, or agitation. Calm, cooperative, and communicative. Appropriate interactions and affect.. Notes Wound exam; the patient does not have an open area. Scarring on the left leg from his previous  trauma. He also has scarring on the right medial lower leg from I think a previous basal cell extraction. He has severe bilateral edema with chronic venous insufficiency right greater than left. On the right this extends above his knee. He also has significant greater than left lymphedema. Skin on his bilateral legs is severely damaged, tight nonelastic and fibrosed Electronic Signature(s) Signed: 05/03/2017 5:27:39 PM By: [name] MD Entered By: [name] on 05/03/2017 12:36:43 MUSA, REWERTS ([id number]) TERI, DILTZ ([id number]) -------------------------------------------------------------------------------- Physician Orders Details Nathan Kirby Date of Service: 05/03/2017 10:30 AM Patient Name: C. Patient Account Number: [id number] Medical Record Treating RN: Phillis Haggis [id number] Number: Other Clinician: Date of Birth/Sex: May 14, 1933 (83 y.o. Male) Treating [name] Primary Care Provider: Daniel Nones Provider/Extender: G Referring Provider: Dorthula Nettles in Treatment: 0 Verbal / Phone Orders: Yes ClinicianAshok Cordia, Debi Read Back and Verified: Yes Diagnosis Coding Wound Cleansing o Cleanse wound with mild soap and water o Other: - may shower Follow-up Appointments o Return Appointment in 2 weeks. Edema Control o Elevate legs to the level of the heart and pump ankles as often as possible o Support Garment 30-40 mm/Hg pressure to: Additional Orders / Instructions o Increase protein intake. Electronic Signature(s) Signed: 05/03/2017 5:03:58 PM By: Alejandro Mulling Signed: 05/03/2017 5:27:39 PM By: [name] MD Entered By: Alejandro Mulling on 05/03/2017 11:55:07 Shirline Frees ([id number]) -------------------------------------------------------------------------------- Problem List Details Nathan Kirby Date of Service: 05/03/2017 10:30 AM Patient Name: C. Patient Account Number: [id number] Medical Record  Treating RN: Phillis Haggis [id number] Number: Other Clinician: Date of Birth/Sex: 05/22/1933 (83 y.o. Male) Treating [name] Primary Care Provider: Daniel Nones Provider/Extender: G Referring Provider: Dorthula Nettles in Treatment: 0 Active Problems ICD-10 Encounter Code Description Active Date Diagnosis I87.323 Chronic venous hypertension (idiopathic) with 05/03/2017 Yes inflammation of bilateral lower extremity I89.0 Lymphedema, not elsewhere classified 05/03/2017 Yes L97.211 Non-pressure chronic ulcer of right calf limited to 05/03/2017 Yes breakdown of skin Inactive Problems Resolved Problems Electronic  Signature(s) Signed: 05/03/2017 5:27:39 PM By: [name] MD Entered By: [name] on 05/03/2017 12:19:29 Shirline Frees ([id number]) -------------------------------------------------------------------------------- Progress Note/History and Physical Details Nathan Kirby Date of Service: 05/03/2017 10:30 AM Patient Name: C. Patient Account Number: [id number] Medical Record Treating RN: Phillis Haggis [id number] Number: Other Clinician: Date of Birth/Sex: [birth year] (83 y.o. Male) Treating [name] Primary Care Provider: Daniel Nones Provider/Extender: G Referring Provider: Daniel Nones Weeks in Treatment: 0 Subjective Chief Complaint Information obtained from Patient Left traumatic calf ulcer/hematoma since mid Nov 2015. History of Present Illness (HPI) Very pleasant 83 year old with past medical history of atrial fibrillation (on Coumadin) and lymphedema. No history of diabetes or peripheral vascular disease. He fell on a rock in mid November 2015 and suffered a laceration and hematoma on his left anterior calf. He underwent suture repair in the emergency room. He subsequently developed an infected hematoma, was hospitalized, and underwent incision, drainage, and debridement in the operating room on 10/28/2014 and 10/30/2014 by Dr. Renda Rolls. Cultures grew coag- negative staph. He was discharged on 11/04/2014 on doxycycline and Augmentin, which he has completed. Initially presented to [facility] in Dec 2015. No significant pain. No rest pain or claudication. ABI not obtained secondary to hematoma. Normal on the contralateral leg. Ambulating per his baseline with a Catapano. Managed with regular debridements, silver/collagen dressing changes, and compression garments He returns to clinic for follow-up and is without complaints. No pain. No drainage. No fever or chills. 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 In spite of this he doesn't seem to of had a really impressive wound history. As noted above he was seen here in 2016 with a traumatic wound and secondary hematoma on the left anterior leg. He is also had a basal cell carcinoma removed on the right leg by his dermatologist Dr. Earlean Polka. He uses 30-40 mm compression stockings and is compliant with these. He tells me he is very careful. Several weeks ago he developed increasing pain and swelling in the right greater than left leg he was seen by his primary physician and I think treated with parenteral antibiotics and then oral antibiotics some combination of Doxy and Cipro. He was felt to have cellulitis of the right leg. A duplex ultrasound of the right leg was apparently negative for DVT although I cannot pull this up in terms of her report. Primary physician's notes on 5/21 suggest a shallow ulcer on the right leg. The patient states he didn't really have an ulcer he was just weeping edema. He does take Lasix 40 mg daily when necessary patient has noncompressible ABIs bilaterally in this clinic. I don't see any formal arterial studies. He has a history of atrial fibrillation on Coumadin he is not a diabetic the patient was seen here in this clinic in 2016 however he  was not seen by any of the existing physician's or providers. AUBERY, DOUTHAT ([id number]) Wound History Patient reportedly has not tested positive for osteomyelitis. Patient reportedly has not had testing performed to evaluate circulation in the legs. Patient experiences the following problems associated with their wounds: swelling. Patient History Information obtained from Patient. Allergies iodine, shellfish derived Family History Cancer - Mother, Diabetes - Mother, Father, Stroke - Mother, Father, No family history of Heart Disease, Hereditary Spherocytosis, Hypertension, Kidney Disease, Lung Disease, Seizures, Thyroid Problems, Tuberculosis. Social History Never smoker, Marital Status - Married, Alcohol Use -  Never, Drug Use - No History, Caffeine Use - Daily. Medical History Eyes Denies history of Cataracts, Glaucoma, Optic Neuritis Ear/Nose/Mouth/Throat Denies history of Chronic sinus problems/congestion, Middle ear problems Hematologic/Lymphatic Patient has history of Lymphedema Denies history of Anemia, Hemophilia, Human Immunodeficiency Virus, Sickle Cell Disease Respiratory Denies history of Aspiration, Asthma, Chronic Obstructive Pulmonary Disease (COPD), Pneumothorax, Sleep Apnea, Tuberculosis Cardiovascular Patient has history of Arrhythmia - a fib, Hypertension Denies history of Angina, Congestive Heart Failure, Coronary Artery Disease, Deep Vein Thrombosis, Hypotension, Myocardial Infarction, Peripheral Arterial Disease, Peripheral Venous Disease, Phlebitis, Vasculitis Gastrointestinal Denies history of Cirrhosis , Colitis, Crohn s, Hepatitis A, Hepatitis B, Hepatitis C Endocrine Denies history of Type I Diabetes, Type II Diabetes Genitourinary Denies history of End Stage Renal Disease Immunological Denies history of Lupus Erythematosus, Raynaud s, Scleroderma Integumentary (Skin) Denies history of History of Burn, History of pressure  wounds Musculoskeletal Denies history of Gout, Rheumatoid Arthritis, Osteoarthritis, Osteomyelitis Neurologic MANOLITO, JUREWICZ ([id number]) Denies history of Dementia, Neuropathy, Quadriplegia, Paraplegia, Seizure Disorder Oncologic Denies history of Received Chemotherapy, Received Radiation Psychiatric Denies history of Anorexia/bulimia, Confinement Anxiety Hospitalization/Surgery History - 10/22/2014, [facility], torn artery. Review of Systems (ROS) Ear/Nose/Mouth/Throat barretts esophagus Hematologic/Lymphatic hx dvt Respiratory The patient has no complaints or symptoms. Cardiovascular hyperlipiedmia Gastrointestinal The patient has no complaints or symptoms. Endocrine The patient has no complaints or symptoms. Genitourinary The patient has no complaints or symptoms. Immunological The patient has no complaints or symptoms. Integumentary (Skin) The patient has no complaints or symptoms. Musculoskeletal The patient has no complaints or symptoms. Neurologic The patient has no complaints or symptoms. Psychiatric The patient has no complaints or symptoms. Objective Constitutional Sitting or standing Blood Pressure is within target range for patient.. Pulse regular and within target range for patient.Marland Kitchen Respirations regular, non-labored and within target range.. Temperature is normal and within the target range for the patient.Marland Kitchen appears in no distress. Vitals Time Taken: 10:31 AM, Height: 68 in, Source: Stated, Weight: 208 lbs, Source: Measured, BMI: 31.6, Temperature: 97.6 F, Pulse: 88 bpm, Respiratory Rate: 18 breaths/min, Blood Pressure: 142/86 mmHg. ROGERICK, BALDWIN ([id number]) Eyes Conjunctivae clear. No discharge. Respiratory Respiratory effort is easy and symmetric bilaterally. Rate is normal at rest and on room air.. Bilateral breath sounds are clear and equal in all lobes with no wheezes, rales or rhonchi.. Cardiovascular Irregular pulse no murmurs JVP is not  elevated. In spite of the edema in his dorsalis pedis pulses are robust. Capillary refill time is normal. Edema present in both extremities. Severe right leg edema with severe inflammation right greater than left.. Gastrointestinal (GI) Abdomen is soft and non-distended without masses or tenderness. Bowel sounds active in all quadrants.. Lymphatic None palpable in the popliteal or inguinal area.Marland Kitchen Psychiatric No evidence of depression, anxiety, or agitation. Calm, cooperative, and communicative. Appropriate interactions and affect.. General Notes: Wound exam; the patient does not have an open area. Scarring on the left leg from his previous trauma. He also has scarring on the right medial lower leg from I think a previous basal cell extraction. He has severe bilateral edema with chronic venous insufficiency right greater than left. On the right this extends above his knee. He also has significant greater than left lymphedema. Skin on his bilateral legs is severely damaged, tight nonelastic and fibrosed Integumentary (Hair, Skin) There is erythema in both lower legs but no tenderness. There is no open wound the right leg is carefully inspected. Assessment Active Problems ICD-10 I87.323 - Chronic venous hypertension (idiopathic) with inflammation of bilateral  lower extremity I89.0 - Lymphedema, not elsewhere classified L97.211 - Non-pressure chronic ulcer of right calf limited to breakdown of skin MALIQ, PILLEY ([id number]) Plan Wound Cleansing: Cleanse wound with mild soap and water Other: - may shower Follow-up Appointments: Return Appointment in 2 weeks. Edema Control: Elevate legs to the level of the heart and pump ankles as often as possible Support Garment 30-40 mm/Hg pressure to: Additional Orders / Instructions: Increase protein intake. #1 severe bilateral secondary lymphedema stage III with severe chronic venous inflammation. #2 he tells me had recent bilateral  cellulitis which is maybe edema in both legs worse. Bilateral cellulitis in itself is a rare thing however I did not see him at the time. He tells me he is better with antibiotics #3 there is no signs of systemic fluid overload #4 he has severe bilateral damage to the skin in his lower legs right greater than left with severe edema. It is surprising to me that he has not had more problems with the skin in the lower extremities than he has. #5 I agree with 30-40 mm compression stockings and we will see if this is sufficient over the next 30 days to control the edema in especially the right leg. If not he may be a candidate for external compression pumps. As noted, he has not had problems with wounds as much as I might of thought however I think the prognosis for skin integrity in the absence of edema control here is bleak #6 no current evidence of cellulitis. The skin is not tender or warm on either leg Electronic Signature(s) Signed: 05/03/2017 12:38:13 PM By: [name] MD Entered By: [name] on 05/03/2017 12:38:13 Shirline Frees ([id number]) -------------------------------------------------------------------------------- ROS/PFSH Details Nathan Kirby Date of Service: 05/03/2017 10:30 AM Patient Name: C. Patient Account Number: [id number] Medical Record Treating RN: Phillis Haggis [id number] Number: Other Clinician: Date of Birth/Sex: 01-Aug-1933 (83 y.o. Male) Treating [name] Primary Care Provider: Daniel Nones Provider/Extender: G Referring Provider: Daniel Nones Weeks in Treatment: 0 Label Progress Note Print Version as History and Physical for this encounter Information Obtained From Patient Wound History Do you currently have one or more open woundso No Have you tested positive for osteomyelitis (bone infection)o No Have you had any tests for circulation on your legso No Have you had other problems associated with your woundso Swelling Eyes Medical  History: Negative for: Cataracts; Glaucoma; Optic Neuritis Ear/Nose/Mouth/Throat Complaints and Symptoms: Review of System Notes: barretts esophagus Medical History: Negative for: Chronic sinus problems/congestion; Middle ear problems Hematologic/Lymphatic Complaints and Symptoms: Review of System Notes: hx dvt Medical History: Positive for: Lymphedema Negative for: Anemia; Hemophilia; Human Immunodeficiency Virus; Sickle Cell Disease Respiratory Complaints and Symptoms: No Complaints or Symptoms Medical History: Negative for: Aspiration; Asthma; Chronic Obstructive Pulmonary Disease (COPD); Pneumothorax; Sleep MARSHAL, ESKEW ([id number]) Apnea; Tuberculosis Cardiovascular Complaints and Symptoms: Review of System Notes: hyperlipiedmia Medical History: Positive for: Arrhythmia - a fib; Hypertension Negative for: Angina; Congestive Heart Failure; Coronary Artery Disease; Deep Vein Thrombosis; Hypotension; Myocardial Infarction; Peripheral Arterial Disease; Peripheral Venous Disease; Phlebitis; Vasculitis Gastrointestinal Complaints and Symptoms: No Complaints or Symptoms Medical History: Negative for: Cirrhosis ; Colitis; Crohnos; Hepatitis A; Hepatitis B; Hepatitis C Endocrine Complaints and Symptoms: No Complaints or Symptoms Medical History: Negative for: Type I Diabetes; Type II Diabetes Genitourinary Complaints and Symptoms: No Complaints or Symptoms Medical History: Negative for: End Stage Renal Disease Immunological Complaints and Symptoms: No Complaints or Symptoms Medical History: Negative for: Lupus Erythematosus; Raynaudos; Scleroderma Integumentary (Skin)  Complaints and Symptoms: No Complaints or Symptoms Medical History: [name], Gaither C. ([id number]) Negative for: History of Burn; History of pressure wounds Musculoskeletal Complaints and Symptoms: No Complaints or Symptoms Medical History: Negative for: Gout; Rheumatoid Arthritis;  Osteoarthritis; Osteomyelitis Neurologic Complaints and Symptoms: No Complaints or Symptoms Medical History: Negative for: Dementia; Neuropathy; Quadriplegia; Paraplegia; Seizure Disorder Oncologic Medical History: Negative for: Received Chemotherapy; Received Radiation Psychiatric Complaints and Symptoms: No Complaints or Symptoms Medical History: Negative for: Anorexia/bulimia; Confinement Anxiety Immunizations Pneumococcal Vaccine: Received Pneumococcal Vaccination: Yes Hospitalization / Surgery History Name of Hospital Purpose of Hospitalization/Surgery Date [facility] torn artery 10/22/2014 Family and Social History Cancer: Yes - Mother; Diabetes: Yes - Mother, Father; Heart Disease: No; Hereditary Spherocytosis: No; Hypertension: No; Kidney Disease: No; Lung Disease: No; Seizures: No; Stroke: Yes - Mother, Father; Thyroid Problems: No; Tuberculosis: No; Never smoker; Marital Status - Married; Alcohol Use: Never; Drug Use: No History; Caffeine Use: Daily; Financial Concerns: No; Food, Clothing or Shelter Needs: No; Support System Lacking: No; Transportation Concerns: No; Living Will: Yes (Copy provided); Medical Power of Attorney: Yes Corrie Dandy- Mary (Copy provided) Electronic Signature(s) Signed: 05/03/2017 5:03:58 PM By: Alejandro MullingPinkerton, Debra Signed: 05/03/2017 5:27:39 PM By: [name], Emanuell Morina MD [name], Djon C. ([id number]) Entered By: Alejandro MullingPinkerton, Debra on 05/03/2017 10:44:19 [name], Elmin C. ([id number]) -------------------------------------------------------------------------------- SuperBill Details Nathan DewWALKER, Tarren Date of Service: 05/03/2017 Patient Name: C. Patient Account Number: [id number] Medical Record Treating RN: Phillis Haggisinkerton, Debi [id number] Number: Other Clinician: Date of Birth/Sex: 1933-10-16 (83 y.o. Male) Treating [name], Kiylee Thoreson Primary Care Provider: Daniel NonesKLEIN, [name] Provider/Extender: G Referring Provider: Daniel NonesKLEIN, [name] Weeks in Treatment: 0 Diagnosis Coding ICD-10  Codes Code Description I87.323 Chronic venous hypertension (idiopathic) with inflammation of bilateral lower extremity I89.0 Lymphedema, not elsewhere classified L97.211 Non-pressure chronic ulcer of right calf limited to breakdown of skin Facility Procedures CPT4 Code: [id number] Description: 99214 - WOUND CARE VISIT-LEV 4 EST PT Modifier: Quantity: 1 Physician Procedures CPT4: Description Modifier Quantity Code [id number] 99204 - WC PHYS LEVEL 4 - NEW PT 1 ICD-10 Description Diagnosis I87.323 Chronic venous hypertension (idiopathic) with inflammation of bilateral lower extremity I89.0 Lymphedema, not elsewhere classified  L97.211 Non-pressure chronic ulcer of right calf limited to breakdown of skin Electronic Signature(s) Signed: 05/03/2017 5:03:58 PM By: Alejandro MullingPinkerton, Debra Signed: 05/03/2017 5:27:39 PM By: [name], Nathaniel Yaden MD Entered By: Alejandro MullingPinkerton, Debra on 05/03/2017 13:07:11

## 2017-05-17 ENCOUNTER — Encounter: Payer: Medicare Other | Admitting: Internal Medicine

## 2017-05-17 DIAGNOSIS — I87323 Chronic venous hypertension (idiopathic) with inflammation of bilateral lower extremity: Secondary | ICD-10-CM | POA: Diagnosis not present

## 2017-05-18 NOTE — Progress Notes (Signed)
Nathan Kirby, [name] ([id number]) Visit Report for 05/17/2017 Arrival Information Details Patient Name: Nathan Kirby, [name] Date of Service: 05/17/2017 9:45 AM Medical Record Number: [id number] Patient Account Number: [id number] Date of Birth/Sex: [birth year](83 y.o. Male) Treating RN: Phillis Kirby, Nathan Kirby Primary Care Nathan Kirby: Nathan Kirby, [name] Other Clinician: Referring Nathan Kirby: Nathan Kirby, [name] Treating Nathan Kirby/Extender: Nathan Kirby, Nathan Kirby in Treatment: 2 Visit Information History Since Last Visit All ordered tests and consults were completed: No Patient Arrived: Nathan MorCane Added or deleted any medications: No Arrival Time: 09:29 Any new allergies or adverse reactions: No Accompanied By: wife Had a fall or experienced change in No Transfer Assistance: None activities of daily living that may affect Patient Identification Verified: Yes risk of falls: Secondary Verification Process Yes Signs or symptoms of abuse/neglect since last No Completed: visito Patient Requires Transmission- No Hospitalized since last visit: No Based Precautions: Pain Present Now: No Patient Has Alerts: Yes Patient Alerts: Patient on Blood Thinner Coumadin L ABI non- compressible R ABI non- compressible Electronic Signature(s) Signed: 05/17/2017 5:11:11 PM By: Nathan MullingPinkerton, Nathan Kirby Entered By: Nathan MullingPinkerton, Nathan Kirby on 05/17/2017 09:32:36 Nathan Kirby, [name] ([id number]) -------------------------------------------------------------------------------- Clinic Level of Care Assessment Details Patient Name: Nathan Kirby, [name] Date of Service: 05/17/2017 9:45 AM Medical Record Number: [id number] Patient Account Number: [id number] Date of Birth/Sex: [birth year] [age] y.o. Male) Treating RN: Phillis Kirby, Nathan Kirby Primary Care Nathan Kirby: Nathan Kirby, [name] Other Clinician: Referring Kelyn Ponciano: Nathan Kirby, [name] Treating Nathan Kirby/Extender: Nathan Kirby, Nathan Kirby in Treatment: 2 Clinic Level of Care Assessment Items TOOL 4 Quantity Score X - Use  when only an EandM is performed on FOLLOW-UP visit 1 0 ASSESSMENTS - Nursing Assessment / Reassessment X - Reassessment of Co-morbidities (includes updates in patient status) 1 10 X - Reassessment of Adherence to Treatment Plan 1 5 ASSESSMENTS - Wound and Skin Assessment / Reassessment []  - Simple Wound Assessment / Reassessment - one wound 0 []  - Complex Wound Assessment / Reassessment - multiple wounds 0 []  - Dermatologic / Skin Assessment (not related to wound area) 0 ASSESSMENTS - Focused Assessment X - Circumferential Edema Measurements - multi extremities 2 5 []  - Nutritional Assessment / Counseling / Intervention 0 []  - Lower Extremity Assessment (monofilament, tuning fork, pulses) 0 []  - Peripheral Arterial Disease Assessment (using hand held doppler) 0 ASSESSMENTS - Ostomy and/or Continence Assessment and Care []  - Incontinence Assessment and Management 0 []  - Ostomy Care Assessment and Management (repouching, etc.) 0 PROCESS - Coordination of Care X - Simple Patient / Family Education for ongoing care 1 15 []  - Complex (extensive) Patient / Family Education for ongoing care 0 []  - Staff obtains ChiropractorConsents, Records, Test Results / Process Orders 0 []  - Staff telephones HHA, Nursing Homes / Clarify orders / etc 0 []  - Routine Transfer to another Facility (non-emergent condition) 0 Nathan Kirby, [name] ([id number]) []  - Routine Hospital Admission (non-emergent condition) 0 []  - New Admissions / Manufacturing engineernsurance Authorizations / Ordering NPWT, Apligraf, etc. 0 []  - Emergency Hospital Admission (emergent condition) 0 X - Simple Discharge Coordination 1 10 []  - Complex (extensive) Discharge Coordination 0 PROCESS - Special Needs []  - Pediatric / Minor Patient Management 0 []  - Isolation Patient Management 0 []  - Hearing / Language / Visual special needs 0 []  - Assessment of Community assistance (transportation, D/C planning, etc.) 0 []  - Additional assistance / Altered mentation 0 []  -  Support Surface(s) Assessment (bed, cushion, seat, etc.) 0 INTERVENTIONS - Wound Cleansing / Measurement []  - Simple Wound Cleansing - one wound 0 []  - Complex Wound Cleansing -  multiple wounds 0 []  - Wound Imaging (photographs - any number of wounds) 0 []  - Wound Tracing (instead of photographs) 0 []  - Simple Wound Measurement - one wound 0 []  - Complex Wound Measurement - multiple wounds 0 INTERVENTIONS - Wound Dressings []  - Small Wound Dressing one or multiple wounds 0 []  - Medium Wound Dressing one or multiple wounds 0 []  - Large Wound Dressing one or multiple wounds 0 []  - Application of Medications - topical 0 []  - Application of Medications - injection 0 INTERVENTIONS - Miscellaneous []  - External ear exam 0 Nathan Kirby ([id number]) []  - Specimen Collection (cultures, biopsies, blood, body fluids, etc.) 0 []  - Specimen(s) / Culture(s) sent or taken to Lab for analysis 0 []  - Patient Transfer (multiple staff / Michiel Sites Lift / Similar devices) 0 []  - Simple Staple / Suture removal (25 or less) 0 []  - Complex Staple / Suture removal (26 or more) 0 []  - Hypo / Hyperglycemic Management (close monitor of Blood Glucose) 0 []  - Ankle / Brachial Index (ABI) - do not check if billed separately 0 X - Vital Signs 1 5 Has the patient been seen at the hospital within the last three years: Yes Total Score: 55 Level Of Care: New/Established - Level 2 Electronic Signature(s) Signed: 05/17/2017 5:11:11 PM By: Nathan Kirby Entered By: Nathan Kirby on 05/17/2017 12:35:35 Nathan Kirby ([id number]) -------------------------------------------------------------------------------- Encounter Discharge Information Details Patient Name: Nathan Kirby Date of Service: 05/17/2017 9:45 AM Medical Record Number: [id number] Patient Account Number: [id number] Date of Birth/Sex: Mar 22, 1933 (83 y.o. Male) Treating RN: Phillis Haggis Primary Care Beacher Every: Nathan Nones Other  Clinician: Referring Khadija Thier: Nathan Nones Treating Apoorva Bugay/Extender: Nathan Disautel in Treatment: 2 Encounter Discharge Information Items Discharge Pain Level: 0 Discharge Condition: Stable Ambulatory Status: Cane Discharge Destination: Home Transportation: Private Auto Accompanied By: wife Schedule Follow-up Appointment: Yes Medication Reconciliation completed No and provided to Patient/Care Vernia Teem: Provided on Clinical Summary of Care: 05/17/2017 Form Type Recipient Paper Patient LW Electronic Signature(s) Signed: 05/17/2017 10:18:28 AM By: Gwenlyn Perking Entered By: Gwenlyn Perking on 05/17/2017 10:18:27 Nathan Kirby ([id number]) -------------------------------------------------------------------------------- Lower Extremity Assessment Details Patient Name: Nathan Kirby. Date of Service: 05/17/2017 9:45 AM Medical Record Number: [id number] Patient Account Number: [id number] Date of Birth/Sex: Apr 20, 1933 (83 y.o. Male) Treating RN: Phillis Haggis Primary Care Edmonia Gonser: Nathan Nones Other Clinician: Referring Ahmia Colford: Nathan Nones Treating Eliah Marquard/Extender: Maxwell Caul Kirby in Treatment: 2 Edema Assessment Assessed: [Left: No] [Right: No] E[Left: dema] [Right: :] Calf Left: Right: Point of Measurement: 37 cm From Medial Instep 47.3 cm 49.6 cm Ankle Left: Right: Point of Measurement: 10 cm From Medial Instep 34 cm 33 cm Vascular Assessment Pulses: Dorsalis Pedis Palpable: [Left:No] [Right:No] Posterior Tibial Palpable: [Left:Yes] [Right:Yes] Extremity colors, hair growth, and conditions: Extremity Color: [Left:Red] [Right:Red] Temperature of Extremity: [Left:Warm] [Right:Warm] Capillary Refill: [Left:< 3 seconds] [Right:< 3 seconds] Toe Nail Assessment Left: Right: Thick: Yes Yes Discolored: Yes Yes Deformed: Yes Yes Improper Length and Hygiene: Yes Yes Electronic Signature(s) Signed: 05/17/2017 5:11:11 PM By: Nathan Kirby Entered By: Nathan Kirby on 05/17/2017 10:12:26 Nathan Kirby ([id number]) -------------------------------------------------------------------------------- Multi Wound Chart Details Patient Name: Nathan Kirby. Date of Service: 05/17/2017 9:45 AM Medical Record Number: [id number] Patient Account Number: [id number] Date of Birth/Sex: 11-21-1933 (83 y.o. Male) Treating RN: Phillis Haggis Primary Care Brady Schiller: Nathan Nones Other Clinician: Referring Meliya Mcconahy: Nathan Nones Treating Damonte Frieson/Extender: Maxwell Caul Kirby in Treatment: 2 Vital Signs Height(in): 68 Pulse(bpm): 74 Weight(lbs): 208  Blood Pressure 148/88 (mmHg): Body Mass Index(BMI): 32 Temperature(F): 97.5 Respiratory Rate 18 (breaths/min): Wound Assessments Treatment Notes Electronic Signature(s) Signed: 05/17/2017 4:47:51 PM By: [name] MD Entered By: [name] on 05/17/2017 10:33:23 Nathan Kirby ([id number]) -------------------------------------------------------------------------------- Multi-Disciplinary Care Plan Details Patient Name: Nathan Kirby, LEVINSON. Date of Service: 05/17/2017 9:45 AM Medical Record Number: [id number] Patient Account Number: [id number] Date of Birth/Sex: 01/05/1933 (83 y.o. Male) Treating RN: Phillis Haggis Primary Care Walta [place]: Nathan Nones Other Clinician: Referring Alysiana Ethridge: Nathan Nones Treating Emma Birchler/Extender: Maxwell Caul Kirby in Treatment: 2 Active Inactive ` Nutrition Nursing Diagnoses: Imbalanced nutrition Potential for alteratiion in Nutrition/Potential for imbalanced nutrition Goals: Patient/caregiver agrees to and verbalizes understanding of need to use nutritional supplements and/or vitamins as prescribed Date Initiated: 05/03/2017 Target Resolution Date: 08/06/2017 Goal Status: Active Interventions: Assess patient nutrition upon admission and as needed per policy Notes: ` Orientation to the Wound Care  Program Nursing Diagnoses: Knowledge deficit related to the wound healing center program Goals: Patient/caregiver will verbalize understanding of the Wound Healing Center Program Date Initiated: 05/03/2017 Target Resolution Date: 06/04/2017 Goal Status: Active Interventions: Provide education on orientation to the wound center Notes: ` Wound/Skin Impairment Nursing Diagnoses: LANDRUM, CARBONELL ([id number]) Impaired tissue integrity Goals: Ulcer/skin breakdown will have a volume reduction of 80% by week 12 Date Initiated: 05/03/2017 Target Resolution Date: 01/07/2018 Goal Status: Active Interventions: Assess patient/caregiver ability to perform ulcer/skin care regimen upon admission and as needed Notes: Electronic Signature(s) Signed: 05/17/2017 5:11:11 PM By: Nathan Kirby Entered By: Nathan Kirby on 05/17/2017 09:36:02 Nathan Kirby ([id number]) -------------------------------------------------------------------------------- Pain Assessment Details Patient Name: Nathan Kirby Date of Service: 05/17/2017 9:45 AM Medical Record Number: [id number] Patient Account Number: [id number] Date of Birth/Sex: [birth year] (83 y.o. Male) Treating RN: Phillis Haggis Primary Care Kycen Spalla: Nathan Nones Other Clinician: Referring Janelys Glassner: Nathan Nones Treating Paola Flynt/Extender: Maxwell Caul Kirby in Treatment: 2 Active Problems Location of Pain Severity and Description of Pain Patient Has Paino No Site Locations With Dressing Change: No Pain Management and Medication Current Pain Management: Electronic Signature(s) Signed: 05/17/2017 5:11:11 PM By: Nathan Kirby Entered By: Nathan Kirby on 05/17/2017 09:32:47 Nathan Kirby ([id number]) -------------------------------------------------------------------------------- Patient/Caregiver Education Details Nathan Kirby Date of Service: 05/17/2017 9:45 AM Patient Name: C. Patient Account Number:  [id number] Medical Record Treating RN: Phillis Haggis [id number] Number: Other Clinician: Date of Birth/Gender: 1933-07-28 (83 y.o. Male) Treating [name] Primary Care Physician: Nathan Nones Physician/Extender: G Referring Physician: Dorthula Nettles in Treatment: 2 Education Assessment Education Provided To: Patient Education Topics Provided Wound/Skin Impairment: Handouts: Other: Wear your compression stockings. Methods: Explain/Verbal Responses: State content correctly Electronic Signature(s) Signed: 05/17/2017 5:11:11 PM By: Nathan Kirby Entered By: Nathan Kirby on 05/17/2017 10:01:25 Nathan Kirby ([id number]) -------------------------------------------------------------------------------- Vitals Details Patient Name: Nathan Kirby Date of Service: 05/17/2017 9:45 AM Medical Record Number: [id number] Patient Account Number: [id number] Date of Birth/Sex: 27-Mar-1933 (83 y.o. Male) Treating RN: Phillis Haggis Primary Care Jerimyah Vandunk: Nathan Nones Other Clinician: Referring Jersey Ravenscroft: Nathan Nones Treating Ainslie Mazurek/Extender: Maxwell Caul Kirby in Treatment: 2 Vital Signs Time Taken: 09:32 Temperature (F): 97.5 Height (in): 68 Pulse (bpm): 74 Weight (lbs): 208 Respiratory Rate (breaths/min): 18 Body Mass Index (BMI): 31.6 Blood Pressure (mmHg): 148/88 Reference Range: 80 - 120 mg / dl Electronic Signature(s) Signed: 05/17/2017 5:11:11 PM By: Nathan Kirby Entered By: Nathan Kirby on 05/17/2017 09:33:08

## 2017-05-18 NOTE — Progress Notes (Signed)
Nathan Kirby, [name] ([id number]) Visit Report for 05/17/2017 Chief Complaint Document Details Nathan DewWALKER, Nathan Kirby Date of Service: 05/17/2017 9:45 AM Patient Name: C. Patient Account Number: [id number] Medical Record Treating RN: Nathan Kirby [id number] Number: Other Clinician: Date of Birth/Sex: Apr 17, 1933 (83 y.o. Male) Treating Nathan Kirby Primary Care Kirby: Nathan Kirby, [name] Kirby/Extender: Nathan Kirby: Nathan Kirby, [name] Weeks in Treatment: 2 Information Obtained from: Patient Chief Complaint Left traumatic calf ulcer/hematoma since mid Nov 2015. Electronic Signature(s) Signed: 05/17/2017 4:47:51 PM By: Nathan Najjarobson, [name] Kirby Entered By: Nathan Kirby on 05/17/2017 10:33:31 Nathan Kirby, [name] ([id number]) -------------------------------------------------------------------------------- HPI Details Nathan Kirby Date of Service: 05/17/2017 9:45 AM Patient Name: C. Patient Account Number: [id number] Medical Record Treating RN: Nathan Kirby [id number] Number: Other Clinician: Date of Birth/Sex: Apr 17, 1933 (83 y.o. Male) Treating Nathan Kirby Primary Care Kirby: Nathan Kirby, [name] Kirby/Extender: Nathan Kirby: Nathan Kirby, [name] Weeks in Treatment: 2 History of Present Illness HPI Description: Very pleasant 83 year old with past medical history of atrial fibrillation (on Coumadin) and lymphedema. No history of diabetes or peripheral vascular disease. He fell on a rock in mid November 2015 and suffered a laceration and hematoma on his left anterior calf. He underwent suture repair in the emergency room. He subsequently developed an infected hematoma, was hospitalized, and underwent incision, drainage, and debridement in the operating room on 10/28/2014 and 10/30/2014 by Nathan Kirby. Cultures grew coag-negative staph. He was discharged on 11/04/2014 on doxycycline and Augmentin, which he has completed. Initially presented to Nathan Kirby in Dec 2015. No significant pain. No  rest pain or claudication. ABI not obtained secondary to hematoma. Normal on the contralateral leg. Ambulating per his baseline with a Nathan Kirby. Managed with regular debridements, silver/collagen dressing changes, and compression garments He returns to clinic for follow-up and is without complaints. No pain. No drainage. No fever or chills. 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 In spite of this he doesn't seem to of had a really impressive wound history. As noted above he was seen here in 2016 with a traumatic wound and secondary hematoma on the left anterior leg. He is also had a basal cell carcinoma removed on the right leg by his dermatologist Dr. Earlean Kirby. He uses 30-40 mm compression stockings and is compliant with these. He tells me he is very careful. Several weeks ago he developed increasing pain and swelling in the right greater than left leg he was seen by his primary physician and I think treated with parenteral antibiotics and then oral antibiotics some combination of Doxy and Cipro. He was felt to have cellulitis of the right leg. A duplex ultrasound of the right leg was apparently negative for DVT although I cannot pull this up in terms of her report. Primary physician's notes on 5/21 suggest a shallow ulcer on the right leg. The patient states he didn't really have an ulcer he was just weeping edema. He does take Lasix 40 mg daily when necessary patient has noncompressible ABIs bilaterally in this clinic. I don't see any formal arterial studies. He has a history of atrial fibrillation on Coumadin he is not a diabetic the patient was seen here in this clinic in 2016 however he was not seen by any of the existing physician's or providers. 05/17/17; the patient has no improvement in his leg measurements using 30-40 compression. He has bilateral chronic venous  inflammation and secondary lymphedema. He has polypoid outgrows from his  skin secondary to stage III lymphedema. He has no current evidence of cellulitis Nathan Kirby ([id number]) Electronic Signature(s) Signed: 05/17/2017 4:47:51 PM By: Nathan Najjar Kirby Entered By: Nathan Najjar on 05/17/2017 10:34:39 Nathan Frees ([id number]) -------------------------------------------------------------------------------- Physical Exam Details Nathan Dew Date of Service: 05/17/2017 9:45 AM Patient Name: C. Patient Account Number: [id number] Medical Record Treating RN: Nathan Haggis [id number] Number: Other Clinician: Date of Birth/Sex: Feb 06, 1933 (83 y.o. Male) Treating Nathan Najjar Primary Care Kirby: Nathan Nones Kirby/Extender: Nathan Kirby: Nathan Nettles in Treatment: 2 Constitutional Patient is hypertensive.. Pulse regular and within target range for patient.Marland Kitchen Respirations regular, non-labored and within target range.. Temperature is normal and within the target range for the patient.Marland Kitchen appears in no distress. Eyes Conjunctivae clear. No discharge. Respiratory Respiratory effort is easy and symmetric bilaterally. Rate is normal at rest and on room air.. Cardiovascular His pedal pulses are palpable even through the swelling. Toes are warm. Lymphatic Nonpalpable in the popliteal or inguinal area. Integumentary (Hair, Skin) Polypoid nodules on the anterior part of both legs I think are's part of stage III lymphedema. Psychiatric No evidence of depression, anxiety, or agitation. Calm, cooperative, and communicative. Appropriate interactions and affect.. Notes Wound exam; the patient does not have an open area however he has severe bilateral chronic venous inflammation in the anterior part of both his legs. Nonpitting edema secondary to secondary lymphedema Electronic Signature(s) Signed: 05/17/2017 4:47:51 PM By: Nathan Najjar Kirby Entered By:  Nathan Najjar on 05/17/2017 10:37:06 Nathan Frees ([id number]) -------------------------------------------------------------------------------- Physician Orders Details Nathan Dew Date of Service: 05/17/2017 9:45 AM Patient Name: C. Patient Account Number: [id number] Medical Record Treating RN: Nathan Haggis [id number] Number: Other Clinician: Date of Birth/Sex: 1933/05/26 (83 y.o. Male) Treating Nathan Najjar Primary Care Kirby: Nathan Nones Kirby/Extender: Nathan Kirby: Nathan Nettles in Treatment: 2 Verbal / Phone Orders: Yes ClinicianAshok Cordia, Kirby Read Back and Verified: Yes Diagnosis Coding Wound Cleansing o Cleanse wound with mild soap and water o Other: - may shower Follow-up Appointments o Return Appointment in 2 weeks. Edema Control o Elevate legs to the level of the heart and pump ankles as often as possible o Support Garment 30-40 mm/Hg pressure to: Additional Orders / Instructions o Increase protein intake. Electronic Signature(s) Signed: 05/17/2017 4:47:51 PM By: Nathan Najjar Kirby Signed: 05/17/2017 5:11:11 PM By: Alejandro Mulling Entered By: Alejandro Mulling on 05/17/2017 10:00:32 Nathan Frees ([id number]) -------------------------------------------------------------------------------- Problem List Details Nathan Dew Date of Service: 05/17/2017 9:45 AM Patient Name: C. Patient Account Number: [id number] Medical Record Treating RN: Nathan Haggis [id number] Number: Other Clinician: Date of Birth/Sex: August 16, 1933 (83 y.o. Male) Treating Nathan Najjar Primary Care Kirby: Nathan Nones Kirby/Extender: Nathan Kirby: Nathan Nettles in Treatment: 2 Active Problems ICD-10 Encounter Code Description Active Date Diagnosis I87.323 Chronic venous hypertension (idiopathic) with 05/03/2017 Yes inflammation of bilateral lower extremity I89.0 Lymphedema, not elsewhere classified 05/03/2017  Yes L97.211 Non-pressure chronic ulcer of right calf limited to 05/03/2017 Yes breakdown of skin Inactive Problems Resolved Problems Electronic Signature(s) Signed: 05/17/2017 4:47:51 PM By: Nathan Najjar Kirby Entered By: Nathan Najjar on 05/17/2017 10:33:17 Nathan Frees ([id number]) -------------------------------------------------------------------------------- Progress Note Details Nathan Dew Date of Service: 05/17/2017 9:45 AM Patient Name: C. Patient Account Number: [id number] Medical Record Treating RN: Nathan Haggis [id number] Number: Other Clinician: Date of Birth/Sex: 1933-02-15 (83 y.o. Male) Treating Nathan Najjar Primary Care Kirby: Nathan Nones Kirby/Extender: Nathan Kirby: Nathan Nettles in Treatment: 2 Subjective Chief Complaint Information obtained from Patient Left traumatic calf  ulcer/hematoma since mid Nov 2015. History of Present Illness (HPI) Very pleasant 83 year old with past medical history of atrial fibrillation (on Coumadin) and lymphedema. No history of diabetes or peripheral vascular disease. He fell on a rock in mid November 2015 and suffered a laceration and hematoma on his left anterior calf. He underwent suture repair in the emergency room. He subsequently developed an infected hematoma, was hospitalized, and underwent incision, drainage, and debridement in the operating room on 10/28/2014 and 10/30/2014 by Dr. Renda Rolls. Cultures grew coag- negative staph. He was discharged on 11/04/2014 on doxycycline and Augmentin, which he has completed. Initially presented to [facility] in Dec 2015. No significant pain. No rest pain or claudication. ABI not obtained secondary to hematoma. Normal on the contralateral leg. Ambulating per his baseline with a [place]nall. Managed with regular debridements, silver/collagen dressing changes, and compression garments He returns to clinic for follow-up and is without complaints. No pain. No drainage.  No fever or chills. 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 In spite of this he doesn't seem to of had a really impressive wound history. As noted above he was seen here in 2016 with a traumatic wound and secondary hematoma on the left anterior leg. He is also had a basal cell carcinoma removed on the right leg by his dermatologist Dr. Earlean Polka. He uses 30-40 mm compression stockings and is compliant with these. He tells me he is very careful. Several weeks ago he developed increasing pain and swelling in the right greater than left leg he was seen by his primary physician and I think treated with parenteral antibiotics and then oral antibiotics some combination of Doxy and Cipro. He was felt to have cellulitis of the right leg. A duplex ultrasound of the right leg was apparently negative for DVT although I cannot pull this up in terms of her report. Primary physician's notes on 5/21 suggest a shallow ulcer on the right leg. The patient states he didn't really have an ulcer he was just weeping edema. He does take Lasix 40 mg daily when necessary patient has noncompressible ABIs bilaterally in this clinic. I don't see any formal arterial studies. He has a history of atrial fibrillation on Coumadin he is not a diabetic the patient was seen here in this clinic in 2016 however he was not seen by any of the existing physician's or providers. MINARD, MILLIRONS ([id number]) 05/17/17; the patient has no improvement in his leg measurements using 30-40 compression. He has bilateral chronic venous inflammation and secondary lymphedema. He has polypoid outgrows from his skin secondary to stage III lymphedema. He has no current evidence of cellulitis Objective Constitutional Patient is hypertensive.. Pulse regular and within target range for patient.Marland Kitchen Respirations regular,  non-labored and within target range.. Temperature is normal and within the target range for the patient.Marland Kitchen appears in no distress. Vitals Time Taken: 9:32 AM, Height: 68 in, Weight: 208 lbs, BMI: 31.6, Temperature: 97.5 F, Pulse: 74 bpm, Respiratory Rate: 18 breaths/min, Blood Pressure: 148/88 mmHg. Eyes Conjunctivae clear. No discharge. Respiratory Respiratory effort is easy and symmetric bilaterally. Rate is normal at rest and on room air.. Cardiovascular His pedal pulses are palpable even through the swelling. Toes are warm. Lymphatic Nonpalpable in the popliteal or inguinal area. Psychiatric No evidence of depression, anxiety, or agitation. Calm, cooperative, and communicative. Appropriate interactions and affect.. General Notes: Wound exam; the patient does  not have an open area however he has severe bilateral chronic venous inflammation in the anterior part of both his legs. Nonpitting edema secondary to secondary lymphedema Integumentary (Hair, Skin) Polypoid nodules on the anterior part of both legs I think are's part of stage III lymphedema. Assessment CONSTANTINE, RUDDICK ([id number]) Active Problems ICD-10 [phone] - Chronic venous hypertension (idiopathic) with inflammation of bilateral lower extremity I89.0 - Lymphedema, not elsewhere classified L97.211 - Non-pressure chronic ulcer of right calf limited to breakdown of skin Plan Wound Cleansing: Cleanse wound with mild soap and water Other: - may shower Follow-up Appointments: Return Appointment in 2 weeks. Edema Control: Elevate legs to the level of the heart and pump ankles as often as possible Support Garment 30-40 mm/Hg pressure to: Additional Orders / Instructions: Increase protein intake. #1 we are going to continue to use his 30/40 compression stockings #2 continue daily exercise with walking although he is limited due to right knee and hip pain apparently secondary to osteoarthritis and he is contemplating  a total knee replacement on the right #3 next visit in 2 weeks Will remeasure his legs if there is no improvement I see little option here but to proceed with external compression pumps. He has had 40-50 stockings before however he could not get these on. Wraparound stockings would not provide for any additional compression to his legs Electronic Signature(s) Signed: 05/17/2017 4:47:51 PM By: Nathan Najjar Kirby Entered By: Nathan Najjar on 05/17/2017 10:38:52 Nathan Frees ([id number]) -------------------------------------------------------------------------------- SuperBill Details Nathan Dew Date of Service: 05/17/2017 Patient Name: C. Patient Account Number: [id number] Medical Record Treating RN: Nathan Haggis [id number] Number: Other Clinician: Date of Birth/Sex: Sep 01, 1933 (83 y.o. Male) Treating Nathan Najjar Primary Care Kirby: Nathan Nones Kirby/Extender: Nathan Kirby: Nathan Nettles in Treatment: 2 Diagnosis Coding ICD-10 Codes Code Description I87.323 Chronic venous hypertension (idiopathic) with inflammation of bilateral lower extremity I89.0 Lymphedema, not elsewhere classified L97.211 Non-pressure chronic ulcer of right calf limited to breakdown of skin Facility Procedures CPT4 Code: [id number] Description: [phone] - WOUND CARE VISIT-LEV 2 EST PT Modifier: Quantity: 1 Physician Procedures CPT4: Description Modifier Quantity Code [id number] - WC PHYS LEVEL 3 - EST PT 1 ICD-10 Description Diagnosis I87.323 Chronic venous hypertension (idiopathic) with inflammation of bilateral lower extremity I89.0 Lymphedema, not elsewhere classified Electronic Signature(s) Signed: 05/17/2017 4:47:51 PM By: Nathan Najjar Kirby Signed: 05/17/2017 5:11:11 PM By: Alejandro Mulling Entered By: Alejandro Mulling on 05/17/2017 12:35:42

## 2017-05-24 ENCOUNTER — Ambulatory Visit: Payer: BC Managed Care – PPO | Admitting: Internal Medicine

## 2017-05-31 ENCOUNTER — Encounter: Payer: Medicare Other | Attending: Internal Medicine | Admitting: Internal Medicine

## 2017-05-31 DIAGNOSIS — J449 Chronic obstructive pulmonary disease, unspecified: Secondary | ICD-10-CM | POA: Diagnosis not present

## 2017-05-31 DIAGNOSIS — Z7901 Long term (current) use of anticoagulants: Secondary | ICD-10-CM | POA: Insufficient documentation

## 2017-05-31 DIAGNOSIS — I739 Peripheral vascular disease, unspecified: Secondary | ICD-10-CM | POA: Insufficient documentation

## 2017-05-31 DIAGNOSIS — L97211 Non-pressure chronic ulcer of right calf limited to breakdown of skin: Secondary | ICD-10-CM | POA: Insufficient documentation

## 2017-05-31 DIAGNOSIS — I890 Lymphedema, not elsewhere classified: Secondary | ICD-10-CM | POA: Diagnosis not present

## 2017-05-31 DIAGNOSIS — I872 Venous insufficiency (chronic) (peripheral): Secondary | ICD-10-CM | POA: Diagnosis not present

## 2017-05-31 DIAGNOSIS — I4891 Unspecified atrial fibrillation: Secondary | ICD-10-CM | POA: Insufficient documentation

## 2017-05-31 DIAGNOSIS — I10 Essential (primary) hypertension: Secondary | ICD-10-CM | POA: Insufficient documentation

## 2017-05-31 DIAGNOSIS — I87323 Chronic venous hypertension (idiopathic) with inflammation of bilateral lower extremity: Secondary | ICD-10-CM | POA: Diagnosis present

## 2017-06-04 NOTE — Progress Notes (Signed)
Nathan Kirby, Nathan Kirby ([id number]) Visit Report for 05/31/2017 Chief Complaint Document Details Nathan Kirby Date of Service: 05/31/2017 1:30 PM Patient Name: C. Patient Account Number: [id number] Medical Record Treating RN: Phillis Haggis [id number] Number: Other Clinician: Date of Birth/Sex: 10/12/1933 (83 y.o. Male) Treating [name] Primary Care Provider: Daniel Nones Provider/Extender: G Referring Provider: Dorthula Nettles in Treatment: 4 Information Obtained from: Patient Chief Complaint Left traumatic calf ulcer/hematoma since mid Nov 2015. Electronic Signature(s) Signed: 05/31/2017 5:35:48 PM By: [name] MD Entered By: [name] on 05/31/2017 16:47:53 Nathan Kirby ([id number]) -------------------------------------------------------------------------------- HPI Details Nathan Kirby Date of Service: 05/31/2017 1:30 PM Patient Name: C. Patient Account Number: [id number] Medical Record Treating RN: Phillis Haggis [id number] Number: Other Clinician: Date of Birth/Sex: [birth year] (83 y.o. Male) Treating [name] Primary Care Provider: Daniel Nones Provider/Extender: G Referring Provider: Dorthula Nettles in Treatment: 4 History of Present Illness HPI Description: Very pleasant 83 year old with past medical history of atrial fibrillation (on Coumadin) and lymphedema. No history of diabetes or peripheral vascular disease. He fell on a rock in mid November 2015 and suffered a laceration and hematoma on his left anterior calf. He underwent suture repair in the emergency room. He subsequently developed an infected hematoma, was hospitalized, and underwent incision, drainage, and debridement in the operating room on 10/28/2014 and 10/30/2014 by Dr. Renda Rolls. Cultures grew coag-negative staph. He was discharged on 11/04/2014 on doxycycline and Augmentin, which he has completed. Initially presented to [facility] in Dec 2015. No significant pain. No rest  pain or claudication. ABI not obtained secondary to hematoma. Normal on the contralateral leg. Ambulating per his baseline with a Nathan Kirby. Managed with regular debridements, silver/collagen dressing changes, and compression garments He returns to clinic for follow-up and is without complaints. No pain. No drainage. No fever or chills. 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 In spite of this he doesn't seem to of had a really impressive wound history. As noted above he was seen here in 2016 with a traumatic wound and secondary hematoma on the left anterior leg. He is also had a basal cell carcinoma removed on the right leg by his dermatologist Dr. Earlean Polka. He uses 30-40 mm compression stockings and is compliant with these. He tells me he is very careful. Several weeks ago he developed increasing pain and swelling in the right greater than left leg he was seen by his primary physician and I think treated with parenteral antibiotics and then oral antibiotics some combination of Doxy and Cipro. He was felt to have cellulitis of the right leg. A duplex ultrasound of the right leg was apparently negative for DVT although I cannot pull this up in terms of her report. Primary physician's notes on 5/21 suggest a shallow ulcer on the right leg. The patient states he didn't really have an ulcer he was just weeping edema. He does take Lasix 40 mg daily when necessary patient has noncompressible ABIs bilaterally in this clinic. I don't see any formal arterial studies. He has a history of atrial fibrillation on Coumadin he is not a diabetic the patient was seen here in this clinic in 2016 however he was not seen by any of the existing physician's or providers. 05/17/17; the patient has no improvement in his leg measurements using 30-40 compression. He has bilateral chronic venous inflammation  and secondary lymphedema. He has polypoid outgrows from his  skin secondary to stage III lymphedema. He has no current evidence of cellulitis 05/31/17; the patient had no improvement in his leg measurements using 30-40 mm compression and he can Anthony, NOCHUM FENTER. ([id number]) no longer get these on. He is using 20-30 mm compression now. He has severe bilateral chronic venous insufficiency with secondary lymphedema stage III. Electronic Signature(s) Signed: 05/31/2017 5:35:48 PM By: [name] MD Entered By: [name] on 05/31/2017 16:48:39 Nathan Kirby ([id number]) -------------------------------------------------------------------------------- Physical Exam Details Nathan Kirby Date of Service: 05/31/2017 1:30 PM Patient Name: C. Patient Account Number: [id number] Medical Record Treating RN: Phillis Haggis [id number] Number: Other Clinician: Date of Birth/Sex: 01-14-1933 (83 y.o. Male) Treating [name] Primary Care Provider: Daniel Nones Provider/Extender: G Referring Provider: Daniel Nones Weeks in Treatment: 4 Constitutional Sitting or standing Blood Pressure is within target range for patient.. Supine Blood Pressure is within target range for patient.. Pulse regular and within target range for patient.Marland Kitchen Respirations regular, non-labored and within target range.Marland Kitchen appears in no distress. Eyes Conjunctivae clear. No discharge. Respiratory Respiratory effort is easy and symmetric bilaterally. Rate is normal at rest and on room air.. Cardiovascular Been through the swelling believe I can feel his peripheral pulses. Severe bilateral chronic venous insufficiency with secondary inflammation and severe bilateral lymphedema with polypoid-like areas of skin distraction.Marland Kitchen Lymphatic Nonpalpable in the popliteal or inguinal area. Psychiatric No evidence of depression, anxiety, or agitation. Calm, cooperative, and communicative. Appropriate interactions and  affect.. Notes Wound exam; this patient has not responded to standard compression in the past and he is not responding to standard compression stockings under our direction here. 30-40 mm stockings did not improve things and he can no longer get these on. Most concerning is that he has severe bilateral chronic venous inflammation and severe bilateral stage lymphedema with evidence of progressive skin deformity. Electronic Signature(s) Signed: 05/31/2017 5:35:48 PM By: [name] MD Entered By: [name] on 05/31/2017 16:50:58 Nathan Kirby ([id number]) -------------------------------------------------------------------------------- Physician Orders Details Nathan Kirby Date of Service: 05/31/2017 1:30 PM Patient Name: C. Patient Account Number: [id number] Medical Record Treating RN: Phillis Haggis [id number] Number: Other Clinician: Date of Birth/Sex: [birth year] (83 y.o. Male) Treating [name] Primary Care Provider: Daniel Nones Provider/Extender: G Referring Provider: Dorthula Nettles in Treatment: 4 Verbal / Phone Orders: Yes Clinician: Ashok Cordia, Debi Read Back and Verified: Yes Diagnosis Coding Wound Cleansing o Cleanse wound with mild soap and water o Other: - may shower Follow-up Appointments o Other: - 3 weeks Edema Control o Elevate legs to the level of the heart and pump ankles as often as possible o Support Garment 20-30 mm/Hg pressure to: o Other: - Order external compression pumps for failed standard compression for 30days. Additional Orders / Instructions o Increase protein intake. o Other: - Order external compression pumps for failed standard compression for 30days. Electronic Signature(s) Signed: 05/31/2017 5:35:48 PM By: [name] MD Signed: 06/03/2017 4:17:34 PM By: Alejandro Mulling Entered By: Alejandro Mulling on 05/31/2017 14:12:10 Nathan Kirby  ([id number]) -------------------------------------------------------------------------------- Problem List Details Nathan Kirby Date of Service: 05/31/2017 1:30 PM Patient Name: C. Patient Account Number: [id number] Medical Record Treating RN: Phillis Haggis [id number] Number: Other Clinician: Date of Birth/Sex: 10-12-1933 (83 y.o. Male) Treating [name] Primary Care Provider: Daniel Nones Provider/Extender: G Referring Provider: Dorthula Nettles in Treatment: 4 Active Problems ICD-10 Encounter Code Description Active Date Diagnosis I87.323 Chronic venous hypertension (idiopathic) with 05/03/2017 Yes inflammation of bilateral lower extremity I89.0 Lymphedema, not elsewhere classified 05/03/2017 Yes L97.211 Non-pressure chronic  ulcer of right calf limited to 05/03/2017 Yes breakdown of skin Inactive Problems Resolved Problems Electronic Signature(s) Signed: 05/31/2017 5:35:48 PM By: [name] MD Entered By: [name] on 05/31/2017 16:47:32 Nathan Kirby ([id number]) -------------------------------------------------------------------------------- Progress Note Details Nathan Kirby Date of Service: 05/31/2017 1:30 PM Patient Name: C. Patient Account Number: [id number] Medical Record Treating RN: Phillis Haggis [id number] Number: Other Clinician: Date of Birth/Sex: 1933-10-13 (83 y.o. Male) Treating [name] Primary Care Provider: Daniel Nones Provider/Extender: G Referring Provider: Dorthula Nettles in Treatment: 4 Subjective Chief Complaint Information obtained from Patient Left traumatic calf ulcer/hematoma since mid Nov 2015. History of Present Illness (HPI) Very pleasant 83 year old with past medical history of atrial fibrillation (on Coumadin) and lymphedema. No history of diabetes or peripheral vascular disease. He fell on a rock in mid November 2015 and suffered a laceration and hematoma on his left anterior calf. He underwent suture  repair in the emergency room. He subsequently developed an infected hematoma, was hospitalized, and underwent incision, drainage, and debridement in the operating room on 10/28/2014 and 10/30/2014 by Dr. Renda Rolls. Cultures grew coag- negative staph. He was discharged on 11/04/2014 on doxycycline and Augmentin, which he has completed. Initially presented to [facility] in Dec 2015. No significant pain. No rest pain or claudication. ABI not obtained secondary to hematoma. Normal on the contralateral leg. Ambulating per his baseline with a Felber. Managed with regular debridements, silver/collagen dressing changes, and compression garments He returns to clinic for follow-up and is without complaints. No pain. No drainage. No fever or chills. 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 In spite of this he doesn't seem to of had a really impressive wound history. As noted above he was seen here in 2016 with a traumatic wound and secondary hematoma on the left anterior leg. He is also had a basal cell carcinoma removed on the right leg by his dermatologist Dr. Earlean Polka. He uses 30-40 mm compression stockings and is compliant with these. He tells me he is very careful. Several weeks ago he developed increasing pain and swelling in the right greater than left leg he was seen by his primary physician and I think treated with parenteral antibiotics and then oral antibiotics some combination of Doxy and Cipro. He was felt to have cellulitis of the right leg. A duplex ultrasound of the right leg was apparently negative for DVT although I cannot pull this up in terms of her report. Primary physician's notes on 5/21 suggest a shallow ulcer on the right leg. The patient states he didn't really have an ulcer he was just weeping edema. He does take Lasix 40 mg daily when necessary patient has  noncompressible ABIs bilaterally in this clinic. I don't see any formal arterial studies. He has a history of atrial fibrillation on Coumadin he is not a diabetic the patient was seen here in this clinic in 2016 however he was not seen by any of the existing physician's or providers. HAYATO, GUAMAN ([id number]) 05/17/17; the patient has no improvement in his leg measurements using 30-40 compression. He has bilateral chronic venous inflammation and secondary lymphedema. He has polypoid outgrows from his skin secondary to stage III lymphedema. He has no current evidence of cellulitis 05/31/17; the patient had no improvement in his leg measurements using 30-40 mm compression and he can no longer get these on. He is using 20-30 mm compression  now. He has severe bilateral chronic venous insufficiency with secondary lymphedema stage III. Objective Constitutional Sitting or standing Blood Pressure is within target range for patient.. Supine Blood Pressure is within target range for patient.. Pulse regular and within target range for patient.Marland Kitchen. Respirations regular, non-labored and within target range.Marland Kitchen. appears in no distress. Vitals Time Taken: 1:28 PM, Height: 68 in, Weight: 208 lbs, BMI: 31.6, Temperature: 98.0 F, Pulse: 70 bpm, Respiratory Rate: 18 breaths/min, Blood Pressure: 130/57 mmHg. Eyes Conjunctivae clear. No discharge. Respiratory Respiratory effort is easy and symmetric bilaterally. Rate is normal at rest and on room air.. Cardiovascular Been through the swelling believe I can feel his peripheral pulses. Severe bilateral chronic venous insufficiency with secondary inflammation and severe bilateral lymphedema with polypoid-like areas of skin distraction.Marland Kitchen. Lymphatic Nonpalpable in the popliteal or inguinal area. Psychiatric No evidence of depression, anxiety, or agitation. Calm, cooperative, and communicative. Appropriate interactions and affect.. General Notes: Wound exam; this  patient has not responded to standard compression in the past and he is not responding to standard compression stockings under our direction here. 30-40 mm stockings did not improve things and he can no longer get these on. Most concerning is that he has severe bilateral chronic Nathan Kirby, [name] ([id number]) venous inflammation and severe bilateral stage lymphedema with evidence of progressive skin deformity. Assessment Active Problems ICD-10 I87.323 - Chronic venous hypertension (idiopathic) with inflammation of bilateral lower extremity I89.0 - Lymphedema, not elsewhere classified L97.211 - Non-pressure chronic ulcer of right calf limited to breakdown of skin Plan Wound Cleansing: Cleanse wound with mild soap and water Other: - may shower Follow-up Appointments: Other: - 3 weeks Edema Control: Elevate legs to the level of the heart and pump ankles as often as possible Support Garment 20-30 mm/Hg pressure to: Other: - Order external compression pumps for failed standard compression for 30days. Additional Orders / Instructions: Increase protein intake. Other: - Order external compression pumps for failed standard compression for 30days. #1 this patient requires external compression pumps bilaterally in order to control severe disfiguring bilateral lower extremity edema which in itself is a combination of lymphedema and chronic venous hypertension with inflammation. He is on diuretics as well but if we do not control this situation I think he is in very short order going to have compromise of skin integrity and severe bilateral lower extremity wounds. #2 we are going to progress to external compression pump application Electronic Signature(s) Nathan Kirby, [name] ([id number]) Signed: 05/31/2017 5:35:48 PM By: [name], Hoyle Barkdull MD Entered By: [name], Denica Web on 05/31/2017 16:52:22 Nathan Kirby, [name]  ([id number]) -------------------------------------------------------------------------------- SuperBill Details Nathan DewWALKER, Nathan Kirby Date of Service: 05/31/2017 Patient Name: C. Patient Account Number: [id number] Medical Record Treating RN: Phillis Haggisinkerton, Debi [id number] Number: Other Clinician: Date of Birth/Sex: Nov 30, 1932 (83 y.o. Male) Treating [name], Sueko Dimichele Primary Care Provider: Daniel NonesKLEIN, [name] Provider/Extender: G Referring Provider: [name], [name] Weeks in Treatment: 4 Diagnosis Coding ICD-10 Codes Code Description I87.323 Chronic venous hypertension (idiopathic) with inflammation of bilateral lower extremity I89.0 Lymphedema, not elsewhere classified L97.211 Non-pressure chronic ulcer of right calf limited to breakdown of skin Facility Procedures CPT4 Code: [id number] Description: [phone] - WOUND CARE VISIT-LEV 2 EST PT Modifier: Quantity: 1 Physician Procedures CPT4: Description Modifier Quantity Code [id number] - WC PHYS LEVEL 3 - EST PT 1 ICD-10 Description Diagnosis I87.323 Chronic venous hypertension (idiopathic) with inflammation of bilateral lower extremity I89.0 Lymphedema, not elsewhere classified Electronic Signature(s) Signed: 05/31/2017 5:35:48 PM By: [name], Elyza Whitt MD Entered By: [name], Salih Williamson on 05/31/2017 16:52:34

## 2017-06-04 NOTE — Progress Notes (Signed)
ZACARIAS, KRAUTER ([id number]) Visit Report for 05/31/2017 Arrival Information Details Patient Name: Nathan Kirby, Nathan Kirby. Date of Service: 05/31/2017 1:30 PM Medical Record Number: [id number] Patient Account Number: [id number] Date of Birth/Sex: 10/19/1933 (83 y.o. Male) Treating RN: Phillis Haggis Primary Care Rayyan Orsborn: Daniel Nones Other Clinician: Referring Hansini Clodfelter: Daniel Nones Treating Cassidy Tashiro/Extender: Altamese [place] in Treatment: 4 Visit Information History Since Last Visit All ordered tests and consults were completed: No Patient Arrived: Gilmer Mor Added or deleted any medications: No Arrival Time: 13:27 Any new allergies or adverse reactions: No Accompanied By: wife Had a fall or experienced change in No Transfer Assistance: None activities of daily living that may affect Patient Identification Verified: Yes risk of falls: Secondary Verification Process Yes Signs or symptoms of abuse/neglect since last No Completed: visito Patient Requires Transmission- No Hospitalized since last visit: No Based Precautions: Pain Present Now: No Patient Has Alerts: Yes Patient Alerts: Patient on Blood Thinner Coumadin L ABI non- compressible R ABI non- compressible Electronic Signature(s) Signed: 06/03/2017 4:17:34 PM By: Alejandro Mulling Entered By: Alejandro Mulling on 05/31/2017 13:28:40 Shirline Frees ([id number]) -------------------------------------------------------------------------------- Clinic Level of Care Assessment Details Patient Name: Shirline Frees Date of Service: 05/31/2017 1:30 PM Medical Record Number: [id number] Patient Account Number: [id number] Date of Birth/Sex: 03-24-1933 (83 y.o. Male) Treating RN: Phillis Haggis Primary Care Juanna Pudlo: Daniel Nones Other Clinician: Referring Dariella Gillihan: Daniel Nones Treating Cassidi Modesitt/Extender: Altamese [place] in Treatment: 4 Clinic Level of Care Assessment Items TOOL 4 Quantity Score X - Use when  only an EandM is performed on FOLLOW-UP visit 1 0 ASSESSMENTS - Nursing Assessment / Reassessment X - Reassessment of Co-morbidities (includes updates in patient status) 1 10 X - Reassessment of Adherence to Treatment Plan 1 5 ASSESSMENTS - Wound and Skin Assessment / Reassessment []  - Simple Wound Assessment / Reassessment - one wound 0 []  - Complex Wound Assessment / Reassessment - multiple wounds 0 []  - Dermatologic / Skin Assessment (not related to wound area) 0 ASSESSMENTS - Focused Assessment X - Circumferential Edema Measurements - multi extremities 2 5 []  - Nutritional Assessment / Counseling / Intervention 0 []  - Lower Extremity Assessment (monofilament, tuning fork, pulses) 0 []  - Peripheral Arterial Disease Assessment (using hand held doppler) 0 ASSESSMENTS - Ostomy and/or Continence Assessment and Care []  - Incontinence Assessment and Management 0 []  - Ostomy Care Assessment and Management (repouching, etc.) 0 PROCESS - Coordination of Care []  - Simple Patient / Family Education for ongoing care 0 X - Complex (extensive) Patient / Family Education for ongoing care 1 20 X - Staff obtains Chiropractor, Records, Test Results / Process Orders 1 10 []  - Staff telephones HHA, Nursing Homes / Clarify orders / etc 0 []  - Routine Transfer to another Facility (non-emergent condition) 0 BERNARDO, BRAYMAN ([id number]) []  - Routine Hospital Admission (non-emergent condition) 0 []  - New Admissions / Manufacturing engineer / Ordering NPWT, Apligraf, etc. 0 []  - Emergency Hospital Admission (emergent condition) 0 X - Simple Discharge Coordination 1 10 []  - Complex (extensive) Discharge Coordination 0 PROCESS - Special Needs []  - Pediatric / Minor Patient Management 0 []  - Isolation Patient Management 0 []  - Hearing / Language / Visual special needs 0 []  - Assessment of Community assistance (transportation, D/C planning, etc.) 0 []  - Additional assistance / Altered mentation 0 []  -  Support Surface(s) Assessment (bed, cushion, seat, etc.) 0 INTERVENTIONS - Wound Cleansing / Measurement []  - Simple Wound Cleansing - one wound 0 []  - Complex Wound  Cleansing - multiple wounds 0 []  - Wound Imaging (photographs - any number of wounds) 0 []  - Wound Tracing (instead of photographs) 0 []  - Simple Wound Measurement - one wound 0 []  - Complex Wound Measurement - multiple wounds 0 INTERVENTIONS - Wound Dressings []  - Small Wound Dressing one or multiple wounds 0 []  - Medium Wound Dressing one or multiple wounds 0 []  - Large Wound Dressing one or multiple wounds 0 []  - Application of Medications - topical 0 []  - Application of Medications - injection 0 INTERVENTIONS - Miscellaneous []  - External ear exam 0 [name], Brondon C. ([id number]) []  - Specimen Collection (cultures, biopsies, blood, body fluids, etc.) 0 []  - Specimen(s) / Culture(s) sent or taken to Lab for analysis 0 []  - Patient Transfer (multiple staff / Michiel SitesHoyer Lift / Similar devices) 0 []  - Simple Staple / Suture removal (25 or less) 0 []  - Complex Staple / Suture removal (26 or more) 0 []  - Hypo / Hyperglycemic Management (close monitor of Blood Glucose) 0 []  - Ankle / Brachial Index (ABI) - do not check if billed separately 0 X - Vital Signs 1 5 Has the patient been seen at the hospital within the last three years: Yes Total Score: 70 Level Of Care: New/Established - Level 2 Electronic Signature(s) Signed: 06/03/2017 4:17:34 PM By: Alejandro MullingPinkerton, Debra Entered By: Alejandro MullingPinkerton, Debra on 05/31/2017 16:32:41 [name], Lea C. ([id number]) -------------------------------------------------------------------------------- Encounter Discharge Information Details Patient Name: [name], Tiger C. Date of Service: 05/31/2017 1:30 PM Medical Record Number: [id number] Patient Account Number: [id number] Date of Birth/Sex: 26-Aug-1933 [age] y.o. Male) Treating RN: Phillis HaggisPinkerton, Debi Primary Care Jeromy Borcherding: Daniel NonesKLEIN, [name] Other  Clinician: Referring Kaili Castille: Daniel NonesKLEIN, [name] Treating Sherri Mcarthy/Extender: Altamese CarolinaOBSON, [name] G Weeks in Treatment: 4 Encounter Discharge Information Items Discharge Pain Level: 0 Discharge Condition: Stable Ambulatory Status: Cane Discharge Destination: Home Transportation: Private Auto Accompanied By: wife Schedule Follow-up Appointment: Yes Medication Reconciliation completed No and provided to Patient/Care Nakai Pollio: Provided on Clinical Summary of Care: 05/31/2017 Form Type Recipient Paper Patient LW Electronic Signature(s) Signed: 05/31/2017 2:02:08 PM By: Gwenlyn PerkingMoore, Shelia Entered By: Gwenlyn PerkingMoore, Shelia on 05/31/2017 14:02:07 [name], Montel C. ([id number]) -------------------------------------------------------------------------------- Lower Extremity Assessment Details Patient Name: [name], Dorsey C. Date of Service: 05/31/2017 1:30 PM Medical Record Number: [id number] Patient Account Number: [id number] Date of Birth/Sex: 26-Aug-1933 [age] y.o. Male) Treating RN: Phillis HaggisPinkerton, Debi Primary Care Mazy Culton: Daniel NonesKLEIN, [name] Other Clinician: Referring Olivia Royse: Daniel NonesKLEIN, [name] Treating Shelma Eiben/Extender: Maxwell CaulOBSON, [name] G Weeks in Treatment: 4 Edema Assessment Assessed: [Left: No] [Right: No] E[Left: dema] [Right: :] Calf Left: Right: Point of Measurement: 37 cm From Medial Instep 47.5 cm 49.6 cm Ankle Left: Right: Point of Measurement: 10 cm From Medial Instep 34 cm 33 cm Vascular Assessment Pulses: Dorsalis Pedis Palpable: [Left:No] [Right:No] Doppler Audible: [Left:Yes] [Right:Yes] Posterior Tibial Extremity colors, hair growth, and conditions: Extremity Color: [Left:Pale] [Right:Red] Temperature of Extremity: [Left:Warm] [Right:Warm] Capillary Refill: [Left:< 3 seconds] [Right:< 3 seconds] Toe Nail Assessment Left: Right: Thick: Yes Yes Discolored: Yes Yes Deformed: No No Improper Length and Hygiene: No No Electronic Signature(s) Signed: 06/03/2017 4:17:34 PM By: Alejandro MullingPinkerton,  Debra Entered By: Alejandro MullingPinkerton, Debra on 05/31/2017 13:37:31 [name], Perseus C. ([id number]) -------------------------------------------------------------------------------- Multi Wound Chart Details Patient Name: [name], Nickolai C. Date of Service: 05/31/2017 1:30 PM Medical Record Number: [id number] Patient Account Number: [id number] Date of Birth/Sex: 26-Aug-1933 [age] y.o. Male) Treating RN: Phillis HaggisPinkerton, Debi Primary Care Sabreen Kitchen: Daniel NonesKLEIN, [name] Other Clinician: Referring Jaskaran Dauzat: Daniel NonesKLEIN, [name] Treating Maela Takeda/Extender: Maxwell CaulOBSON, [name] G Weeks in Treatment: 4 Vital Signs Height(in): 68 Pulse(bpm):  70 Weight(lbs): 208 Blood Pressure 130/57 (mmHg): Body Mass Index(BMI): 32 Temperature(F): 98.0 Respiratory Rate 18 (breaths/min): Wound Assessments Treatment Notes Electronic Signature(s) Signed: 05/31/2017 5:35:48 PM By: [name] MD Entered By: [name] on 05/31/2017 16:47:36 Shirline Frees ([id number]) -------------------------------------------------------------------------------- Multi-Disciplinary Care Plan Details Patient Name: SEYDINA, HOLLIMAN. Date of Service: 05/31/2017 1:30 PM Medical Record Number: [id number] Patient Account Number: [id number] Date of Birth/Sex: 1933/03/05 (83 y.o. Male) Treating RN: Phillis Haggis Primary Care Shanya Ferriss: Daniel Nones Other Clinician: Referring Shekinah Pitones: Daniel Nones Treating Shivangi Lutz/Extender: Maxwell Caul Weeks in Treatment: 4 Active Inactive ` Nutrition Nursing Diagnoses: Imbalanced nutrition Potential for alteratiion in Nutrition/Potential for imbalanced nutrition Goals: Patient/caregiver agrees to and verbalizes understanding of need to use nutritional supplements and/or vitamins as prescribed Date Initiated: 05/03/2017 Target Resolution Date: 08/06/2017 Goal Status: Active Interventions: Assess patient nutrition upon admission and as needed per policy Notes: ` Orientation to the Wound Care  Program Nursing Diagnoses: Knowledge deficit related to the wound healing center program Goals: Patient/caregiver will verbalize understanding of the Wound Healing Center Program Date Initiated: 05/03/2017 Target Resolution Date: 06/04/2017 Goal Status: Active Interventions: Provide education on orientation to the wound center Notes: ` Wound/Skin Impairment Nursing Diagnoses: KAYLIN, SCHELLENBERG ([id number]) Impaired tissue integrity Goals: Ulcer/skin breakdown will have a volume reduction of 80% by week 12 Date Initiated: 05/03/2017 Target Resolution Date: 01/07/2018 Goal Status: Active Interventions: Assess patient/caregiver ability to perform ulcer/skin care regimen upon admission and as needed Notes: Electronic Signature(s) Signed: 06/03/2017 4:17:34 PM By: Alejandro Mulling Entered By: Alejandro Mulling on 05/31/2017 13:37:12 Shirline Frees ([id number]) -------------------------------------------------------------------------------- Non-Wound Condition Assessment Details Patient Name: Shirline Frees Date of Service: 05/31/2017 1:30 PM Medical Record Number: [id number] Patient Account Number: [id number] Date of Birth/Sex: 26-Dec-1932 (83 y.o. Male) Treating RN: Phillis Haggis Primary Care Takita Riecke: Daniel Nones Other Clinician: Referring Dominico Rod: Daniel Nones Treating Ellene Bloodsaw/Extender: Maxwell Caul Weeks in Treatment: 4 Non-Wound Condition: Condition: Cellulitis Location: Leg Side: Right Photos Periwound Skin Texture Texture Color No Abnormalities Noted: No No Abnormalities Noted: No Moisture No Abnormalities Noted: No Electronic Signature(s) Signed: 06/03/2017 4:17:34 PM By: Alejandro Mulling Entered By: Alejandro Mulling on 05/31/2017 14:13:37 Shirline Frees ([id number]) -------------------------------------------------------------------------------- Non-Wound Condition Assessment Details Patient Name: Shirline Frees Date of Service: 05/31/2017 1:30  PM Medical Record Number: [id number] Patient Account Number: [id number] Date of Birth/Sex: [birth year] (83 y.o. Male) Treating RN: Phillis Haggis Primary Care Anais Koenen: Daniel Nones Other Clinician: Referring Keeli Roberg: Daniel Nones Treating Yamel Bale/Extender: Maxwell Caul Weeks in Treatment: 4 Non-Wound Condition: Condition: Cellulitis Location: Leg Side: Left Photos Periwound Skin Texture Texture Color No Abnormalities Noted: No No Abnormalities Noted: No Moisture No Abnormalities Noted: No Electronic Signature(s) Signed: 06/03/2017 4:17:34 PM By: Alejandro Mulling Entered By: Alejandro Mulling on 05/31/2017 14:14:04 Shirline Frees ([id number]) -------------------------------------------------------------------------------- Pain Assessment Details Patient Name: Shirline Frees Date of Service: 05/31/2017 1:30 PM Medical Record Number: [id number] Patient Account Number: [id number] Date of Birth/Sex: [birth year] (83 y.o. Male) Treating RN: Phillis Haggis Primary Care Haylie Mccutcheon: Daniel Nones Other Clinician: Referring Analiah Drum: Daniel Nones Treating Xayden Linsey/Extender: Maxwell Caul Weeks in Treatment: 4 Active Problems Location of Pain Severity and Description of Pain Patient Has Paino No Site Locations With Dressing Change: No Pain Management and Medication Current Pain Management: Electronic Signature(s) Signed: 06/03/2017 4:17:34 PM By: Alejandro Mulling Entered By: Alejandro Mulling on 05/31/2017 13:28:46 Shirline Frees ([id number]) -------------------------------------------------------------------------------- Patient/Caregiver Education Details [name] Date of Service: 05/31/2017 1:30 PM Patient Name: C. Patient Account Number: [id number] Medical Record Treating  RN: Phillis Haggis [id number] Number: Other Clinician: Date of Birth/Gender: Feb 05, 1933 (83 y.o. Male) Treating [name] Primary Care Physician: Daniel Nones Physician/Extender:  G Referring Physician: Dorthula Nettles in Treatment: 4 Education Assessment Education Provided To: Patient Education Topics Provided Wound/Skin Impairment: Handouts: Other: wear compression as ordered Methods: Explain/Verbal Responses: State content correctly Electronic Signature(s) Signed: 06/03/2017 4:17:34 PM By: Alejandro Mulling Entered By: Alejandro Mulling on 05/31/2017 13:57:48 Shirline Frees ([id number]) -------------------------------------------------------------------------------- Vitals Details Patient Name: Shirline Frees Date of Service: 05/31/2017 1:30 PM Medical Record Number: [id number] Patient Account Number: [id number] Date of Birth/Sex: Nov 29, 1933 (83 y.o. Male) Treating RN: Phillis Haggis Primary Care Terrisa Curfman: Daniel Nones Other Clinician: Referring Airyanna Dipalma: Daniel Nones Treating Nyala Kirchner/Extender: Maxwell Caul Weeks in Treatment: 4 Vital Signs Time Taken: 13:28 Temperature (F): 98.0 Height (in): 68 Pulse (bpm): 70 Weight (lbs): 208 Respiratory Rate (breaths/min): 18 Body Mass Index (BMI): 31.6 Blood Pressure (mmHg): 130/57 Reference Range: 80 - 120 mg / dl Electronic Signature(s) Signed: 06/03/2017 4:17:34 PM By: Alejandro Mulling Entered By: Alejandro Mulling on 05/31/2017 13:31:12

## 2017-06-21 ENCOUNTER — Encounter: Payer: Medicare Other | Admitting: Internal Medicine

## 2017-06-21 DIAGNOSIS — I87323 Chronic venous hypertension (idiopathic) with inflammation of bilateral lower extremity: Secondary | ICD-10-CM | POA: Diagnosis not present

## 2017-06-22 NOTE — Progress Notes (Signed)
Nathan Kirby, [name] ([id number]) Visit Report for 06/21/2017 Arrival Information Details Patient Name: Nathan Kirby, [name] Date of Service: 06/21/2017 1:30 PM Medical Record Number: [id number] Patient Account Number: [id number] Date of Birth/Sex: 1933/02/04 [age] y.o. Male) Treating RN: Phillis HaggisPinkerton, Debi Primary Care Karthik Whittinghill: Daniel NonesKLEIN, [name] Other Clinician: Referring Yama Nielson: Daniel NonesKLEIN, [name] Treating Sharmarke Cicio/Extender: Altamese CarolinaOBSON, [name] G Weeks in Treatment: 7 Visit Information History Since Last Visit All ordered tests and consults were completed: No Patient Arrived: Nathan MorCane Added or deleted any medications: No Arrival Time: 13:23 Any new allergies or adverse reactions: No Accompanied By: wife Had a fall or experienced change in No Transfer Assistance: None activities of daily living that may affect Patient Identification Verified: Yes risk of falls: Secondary Verification Process Yes Signs or symptoms of abuse/neglect since last No Completed: visito Patient Requires Transmission- No Hospitalized since last visit: No Based Precautions: Pain Present Now: No Patient Has Alerts: Yes Patient Alerts: Patient on Blood Thinner Coumadin L ABI non- compressible R ABI non- compressible Electronic Signature(s) Signed: 06/21/2017 4:38:41 PM By: Alejandro MullingPinkerton, Debra Entered By: Alejandro MullingPinkerton, Debra on 06/21/2017 13:27:05 Nathan Kirby, [name] ([id number]) -------------------------------------------------------------------------------- Clinic Level of Care Assessment Details Patient Name: Nathan Kirby, [name] Date of Service: 06/21/2017 1:30 PM Medical Record Number: [id number] Patient Account Number: [id number] Date of Birth/Sex: 1933/02/04 10(83 y.o. Male) Treating RN: Phillis HaggisPinkerton, Debi Primary Care Krystal Teachey: Daniel NonesKLEIN, [name] Other Clinician: Referring Laryah Neuser: Daniel NonesKLEIN, [name] Treating Adisa Litt/Extender: Altamese CarolinaOBSON, [name] G Weeks in Treatment: 7 Clinic Level of Care Assessment Items TOOL 4 Quantity Score X - Use  when only an EandM is performed on FOLLOW-UP visit 1 0 ASSESSMENTS - Nursing Assessment / Reassessment X - Reassessment of Co-morbidities (includes updates in patient status) 1 10 X - Reassessment of Adherence to Treatment Plan 1 5 ASSESSMENTS - Wound and Skin Assessment / Reassessment []  - Simple Wound Assessment / Reassessment - one wound 0 []  - Complex Wound Assessment / Reassessment - multiple wounds 0 []  - Dermatologic / Skin Assessment (not related to wound area) 0 ASSESSMENTS - Focused Assessment X - Circumferential Edema Measurements - multi extremities 2 5 []  - Nutritional Assessment / Counseling / Intervention 0 []  - Lower Extremity Assessment (monofilament, tuning fork, pulses) 0 []  - Peripheral Arterial Disease Assessment (using hand held doppler) 0 ASSESSMENTS - Ostomy and/or Continence Assessment and Care []  - Incontinence Assessment and Management 0 []  - Ostomy Care Assessment and Management (repouching, etc.) 0 PROCESS - Coordination of Care X - Simple Patient / Family Education for ongoing care 1 15 []  - Complex (extensive) Patient / Family Education for ongoing care 0 []  - Staff obtains ChiropractorConsents, Records, Test Results / Process Orders 0 []  - Staff telephones HHA, Nursing Homes / Clarify orders / etc 0 []  - Routine Transfer to another Facility (non-emergent condition) 0 Nathan Kirby, [name] ([id number]) []  - Routine Hospital Admission (non-emergent condition) 0 []  - New Admissions / Manufacturing engineernsurance Authorizations / Ordering NPWT, Apligraf, etc. 0 []  - Emergency Hospital Admission (emergent condition) 0 X - Simple Discharge Coordination 1 10 []  - Complex (extensive) Discharge Coordination 0 PROCESS - Special Needs []  - Pediatric / Minor Patient Management 0 []  - Isolation Patient Management 0 []  - Hearing / Language / Visual special needs 0 []  - Assessment of Community assistance (transportation, D/C planning, etc.) 0 []  - Additional assistance / Altered mentation 0 []  -  Support Surface(s) Assessment (bed, cushion, seat, etc.) 0 INTERVENTIONS - Wound Cleansing / Measurement []  - Simple Wound Cleansing - one wound 0 []  - Complex Wound Cleansing -  multiple wounds 0 X - Wound Imaging (photographs - any number of wounds) 1 5 []  - Wound Tracing (instead of photographs) 0 []  - Simple Wound Measurement - one wound 0 []  - Complex Wound Measurement - multiple wounds 0 INTERVENTIONS - Wound Dressings []  - Small Wound Dressing one or multiple wounds 0 []  - Medium Wound Dressing one or multiple wounds 0 []  - Large Wound Dressing one or multiple wounds 0 []  - Application of Medications - topical 0 []  - Application of Medications - injection 0 INTERVENTIONS - Miscellaneous []  - External ear exam 0 Nathan Kirby ([id number]) []  - Specimen Collection (cultures, biopsies, blood, body fluids, etc.) 0 []  - Specimen(s) / Culture(s) sent or taken to Lab for analysis 0 []  - Patient Transfer (multiple staff / Michiel Sites Lift / Similar devices) 0 []  - Simple Staple / Suture removal (25 or less) 0 []  - Complex Staple / Suture removal (26 or more) 0 []  - Hypo / Hyperglycemic Management (close monitor of Blood Glucose) 0 []  - Ankle / Brachial Index (ABI) - do not check if billed separately 0 X - Vital Signs 1 5 Has the patient been seen at the hospital within the last three years: Yes Total Score: 60 Level Of Care: New/Established - Level 2 Electronic Signature(s) Signed: 06/21/2017 4:38:41 PM By: Alejandro Mulling Entered By: Alejandro Mulling on 06/21/2017 15:41:32 Nathan Kirby ([id number]) -------------------------------------------------------------------------------- Encounter Discharge Information Details Patient Name: Nathan Kirby Date of Service: 06/21/2017 1:30 PM Medical Record Number: [id number] Patient Account Number: [id number] Date of Birth/Sex: January 31, 1933 (83 y.o. Male) Treating RN: Phillis Haggis Primary Care Karim Aiello: Daniel Nones Other  Clinician: Referring Valary Manahan: Daniel Nones Treating Jadence Kinlaw/Extender: Altamese [place] in Treatment: 7 Encounter Discharge Information Items Discharge Pain Level: 0 Discharge Condition: Stable Ambulatory Status: Cane Discharge Destination: Home Transportation: Private Auto Accompanied By: wife Schedule Follow-up Appointment: No Medication Reconciliation completed No and provided to Patient/Care Cheryln Balcom: Provided on Clinical Summary of Care: 06/21/2017 Form Type Recipient Paper Patient LW Electronic Signature(s) Signed: 06/21/2017 1:57:02 PM By: Gwenlyn Perking Entered By: Gwenlyn Perking on 06/21/2017 13:57:01 Nathan Kirby ([id number]) -------------------------------------------------------------------------------- Lower Extremity Assessment Details Patient Name: Nathan Kirby Date of Service: 06/21/2017 1:30 PM Medical Record Number: [id number] Patient Account Number: [id number] Date of Birth/Sex: 1933/01/07 (83 y.o. Male) Treating RN: Phillis Haggis Primary Care Brittony Billick: Daniel Nones Other Clinician: Referring Smrithi Pigford: Daniel Nones Treating Cloma Rahrig/Extender: Maxwell Caul Weeks in Treatment: 7 Edema Assessment Assessed: [Left: No] [Right: No] E[Left: dema] [Right: :] Calf Left: Right: Point of Measurement: 37 cm From Medial Instep 45.7 cm 46.3 cm Ankle Left: Right: Point of Measurement: 10 cm From Medial Instep 33 cm 31.6 cm Vascular Assessment Pulses: Dorsalis Pedis Palpable: [Left:No] [Right:No] Doppler Audible: [Left:Yes] [Right:Yes] Posterior Tibial Extremity colors, hair growth, and conditions: Extremity Color: [Left:Hyperpigmented] [Right:Hyperpigmented] Temperature of Extremity: [Left:Warm] [Right:Warm] Capillary Refill: [Left:< 3 seconds] [Right:< 3 seconds] Toe Nail Assessment Left: Right: Thick: Yes Yes Discolored: Yes Yes Deformed: No No Improper Length and Hygiene: No No Electronic Signature(s) Signed: 06/21/2017 4:38:41 PM  By: Alejandro Mulling Entered By: Alejandro Mulling on 06/21/2017 13:31:26 Nathan Kirby ([id number]) -------------------------------------------------------------------------------- Multi Wound Chart Details Patient Name: Nathan Kirby Date of Service: 06/21/2017 1:30 PM Medical Record Number: [id number] Patient Account Number: [id number] Date of Birth/Sex: [birth year] (83 y.o. Male) Treating RN: Phillis Haggis Primary Care Tiny Chaudhary: Daniel Nones Other Clinician: Referring Mynor Witkop: Daniel Nones Treating Rosalie Gelpi/Extender: Maxwell Caul Weeks in Treatment: 7 Vital Signs Height(in): 68 Pulse(bpm): 58  Weight(lbs): 208 Blood Pressure 140/66 (mmHg): Body Mass Index(BMI): 32 Temperature(F): 97.7 Respiratory Rate 18 (breaths/min): Wound Assessments Treatment Notes Electronic Signature(s) Signed: 06/21/2017 4:38:41 PM By: Alejandro MullingPinkerton, Debra Entered By: Alejandro MullingPinkerton, Debra on 06/21/2017 13:50:24 Nathan Kirby, Hamsa C. ([id number]) -------------------------------------------------------------------------------- Multi-Disciplinary Care Plan Details Patient Name: Nathan Kirby, Harrel C. Date of Service: 06/21/2017 1:30 PM Medical Record Number: [id number] Patient Account Number: [id number] Date of Birth/Sex: 03/28/1933 [age] y.o. Male) Treating RN: Phillis HaggisPinkerton, Debi Primary Care Nesanel Aguila: Daniel NonesKLEIN, [name] Other Clinician: Referring Kenni Newton: Daniel NonesKLEIN, [name] Treating Naturi Alarid/Extender: Maxwell CaulOBSON, [name] G Weeks in Treatment: 7 Active Inactive Electronic Signature(s) Signed: 06/21/2017 4:38:41 PM By: Alejandro MullingPinkerton, Debra Entered By: Alejandro MullingPinkerton, Debra on 06/21/2017 13:50:17 Nathan Kirby, Malakie C. ([id number]) -------------------------------------------------------------------------------- Non-Wound Condition Assessment Details [name], [place] Date of Service: 06/21/2017 1:30 PM Patient Name: C. Patient Account Number: [id number] Medical Record Treating RN: Phillis Haggisinkerton, Debi [id number] Number: Other  Clinician: Date of Birth/Gender: 03/28/1933 (83 y.o. Male) Treating [name], [name] Primary Care Physician: Daniel NonesKLEIN, [name] Physician/Extender: G Referring Physician: Daniel NonesKLEIN, [name] Weeks in Treatment: 7 Non-Wound Condition: Condition: Cellulitis Location: Leg Side: Right Photos Periwound Skin Texture Texture Color No Abnormalities Noted: No No Abnormalities Noted: No Moisture No Abnormalities Noted: No Electronic Signature(s) Signed: 06/21/2017 4:38:41 PM By: Alejandro MullingPinkerton, Debra Entered By: Alejandro MullingPinkerton, Debra on 06/21/2017 14:09:04 Nathan Kirby, Jyaire C. ([id number]) -------------------------------------------------------------------------------- Non-Wound Condition Assessment Details [name], [place] Date of Service: 06/21/2017 1:30 PM Patient Name: C. Patient Account Number: [id number] Medical Record Treating RN: Phillis Haggisinkerton, Debi [id number] Number: Other Clinician: Date of Birth/Gender: 03/28/1933 (83 y.o. Male) Treating [name], [name] Primary Care Physician: Daniel NonesKLEIN, [name] Physician/Extender: G Referring Physician: Daniel NonesKLEIN, [name] Weeks in Treatment: 7 Non-Wound Condition: Condition: Cellulitis Location: Leg Side: Left Photos Periwound Skin Texture Texture Color No Abnormalities Noted: No No Abnormalities Noted: No Moisture No Abnormalities Noted: No Electronic Signature(s) Signed: 06/21/2017 4:38:41 PM By: Alejandro MullingPinkerton, Debra Entered By: Alejandro MullingPinkerton, Debra on 06/21/2017 14:09:04 Nathan Kirby, Jimmie C. ([id number]) -------------------------------------------------------------------------------- Pain Assessment Details Patient Name: Nathan Kirby, Aldin C. Date of Service: 06/21/2017 1:30 PM Medical Record Number: [id number] Patient Account Number: [id number] Date of Birth/Sex: 03/28/1933 [age] y.o. Male) Treating RN: Phillis HaggisPinkerton, Debi Primary Care Mckaila Duffus: Daniel NonesKLEIN, [name] Other Clinician: Referring Moxie Kalil: Daniel NonesKLEIN, [name] Treating Vaudie Engebretsen/Extender: Maxwell CaulOBSON, [name] G Weeks in Treatment: 7 Active  Problems Location of Pain Severity and Description of Pain Patient Has Paino No Site Locations With Dressing Change: No Pain Management and Medication Current Pain Management: Electronic Signature(s) Signed: 06/21/2017 4:38:41 PM By: Alejandro MullingPinkerton, Debra Entered By: Alejandro MullingPinkerton, Debra on 06/21/2017 13:27:12 Nathan Kirby, Krithik C. ([id number]) -------------------------------------------------------------------------------- Patient/Caregiver Education Details [name], Mccade Date of Service: 06/21/2017 1:30 PM Patient Name: C. Patient Account Number: [id number] Medical Record Treating RN: Phillis Haggisinkerton, Debi [id number] Number: Other Clinician: Date of Birth/Gender: 03/28/1933 (83 y.o. Male) Treating [name], [name] Primary Care Physician: Daniel NonesKLEIN, [name] Physician/Extender: G Referring Physician: [name], [name] Weeks in Treatment: 7 Education Assessment Education Provided To: Patient Education Topics Provided Wound/Skin Impairment: Handouts: Other: Please call our office if you have any questions or concerns. Methods: Explain/Verbal Responses: State content correctly Electronic Signature(s) Signed: 06/21/2017 4:38:41 PM By: Alejandro MullingPinkerton, Debra Entered By: Alejandro MullingPinkerton, Debra on 06/21/2017 13:52:37 Nathan Kirby, Demitrus C. ([id number]) -------------------------------------------------------------------------------- Vitals Details Patient Name: Nathan Kirby, Buckley C. Date of Service: 06/21/2017 1:30 PM Medical Record Number: [id number] Patient Account Number: [id number] Date of Birth/Sex: 03/28/1933 97(83 y.o. Male) Treating RN: Phillis HaggisPinkerton, Debi Primary Care Marialuiza Car: Daniel NonesKLEIN, [name] Other Clinician: Referring Birgit Nowling: Daniel NonesKLEIN, [name] Treating Oaklen Thiam/Extender: Maxwell CaulOBSON, [name] G Weeks in Treatment: 7 Vital Signs Time Taken: 13:31 Temperature (F): 97.7 Height (in): 68 Pulse (bpm): 58 Weight (lbs): 208 Respiratory Rate (  breaths/min): 18 Body Mass Index (BMI): 31.6 Blood Pressure (mmHg): 140/66 Reference Range:  80 - 120 mg / dl Pulse Oximetry (%): 782 Electronic Signature(s) Signed: 06/21/2017 4:38:41 PM By: Alejandro Mulling Entered By: Alejandro Mulling on 06/21/2017 13:36:05

## 2017-06-22 NOTE — Progress Notes (Signed)
Nathan Kirby Kirby, Nathan Kirby Kirby ([id number]) Visit Report for 06/21/2017 HPI Details Nathan Kirby Kirby Date of Service: 06/21/2017 1:30 PM Patient Name: C. Patient Account Number: [id number] Medical Record Treating RN: Phillis Haggis [id number] Number: Other Clinician: Date of Birth/Sex: 11/25/1933 (83 y.o. Male) Treating [name] Primary Care Provider: Daniel Nones Provider/Extender: G Referring Provider: Dorthula Nettles in Treatment: 7 History of Present Illness HPI Description: Problem is he can't say anything to these people because. Very pleasant 83 year old with past medical history of atrial fibrillation (on Coumadin) and lymphedema. No history of diabetes or peripheral vascular disease. He fell on a rock in mid November 2015 and suffered a laceration and hematoma on his left anterior calf. He underwent suture repair in the emergency room. He subsequently developed an infected hematoma, was hospitalized, and underwent incision, drainage, and debridement in the operating room on 10/28/2014 and 10/30/2014 by Dr. Renda Rolls. Cultures grew coag-negative staph. He was discharged on 11/04/2014 on doxycycline and Augmentin, which he has completed. Initially presented to [facility] in Dec 2015. No significant pain. No rest pain or claudication. ABI not obtained secondary to hematoma. Normal on the contralateral leg. Ambulating per his baseline with a [place]. Managed with regular debridements, silver/collagen dressing changes, and compression garments He returns to clinic for follow-up and is without complaints. No pain. No drainage. No fever or chills. 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 In spite of this he doesn't seem to of had a really impressive wound history. As noted above he was seen here in 2016 with a traumatic wound and secondary hematoma on the left anterior leg. He is  also had a basal cell carcinoma removed on the right leg by his dermatologist Dr. Earlean Polka. He uses 30-40 mm compression stockings and is compliant with these. He tells me he is very careful. Several weeks ago he developed increasing pain and swelling in the right greater than left leg he was seen by his primary physician and I think treated with parenteral antibiotics and then oral antibiotics some combination of Doxy and Cipro. He was felt to have cellulitis of the right leg. A duplex ultrasound of the right leg was apparently negative for DVT although I cannot pull this up in terms of her report. Primary physician's notes on 5/21 suggest a shallow ulcer on the right leg. The patient states he didn't really have an ulcer he was just weeping edema. He does take Lasix 40 mg daily when necessary patient has noncompressible ABIs bilaterally in this clinic. I don't see any formal arterial studies. He has a history of atrial fibrillation on Coumadin he is not a diabetic the patient was seen here in this clinic in 2016 however he was not seen by any of the existing physician's or providers. 05/17/17; the patient has no improvement in his leg measurements using 30-40 compression. He has Nathan Kirby, Nathan Kirby Kirby ([id number]) bilateral chronic venous inflammation and secondary lymphedema. He has polypoid outgrows from his skin secondary to stage III lymphedema. He has no current evidence of cellulitis 05/31/17; the patient had no improvement in his leg measurements using 30-40 mm compression and he can no longer get these on. He is using 20-30 mm compression now. 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 He has obtained compression pumps for his stage  III bilateral lymphedema. The circumference of his legs is completely better. He is using these twice a day for  1 hour. He also has 20-30 mm compression stockings and is using lotion. He has no open wounds Electronic Signature(s) Signed: 06/21/2017 5:23:04 PM By: [name], [name] MD Entered By: [name], [name] on 06/21/2017 14:33:48 [name], Nathan Kirby C. ([id number]) -------------------------------------------------------------------------------- Physical Exam Details Nathan Kirby DewWALKER, Nathan Kirby Date of Service: 06/21/2017 1:30 PM Patient Name: C. Patient Account Number: [id number] Medical Record Treating RN: Phillis Haggisinkerton, Debi [id number] Number: Other Clinician: Date of Birth/Sex: 1933/09/28 (83 y.o. Male) Treating [name], [name] Primary Care Provider: Daniel NonesKLEIN, [name] Provider/Extender: G Referring Provider: Daniel NonesKLEIN, [name] Weeks in Treatment: 7 Constitutional Sitting or standing Blood Pressure is within target range for patient.. Pulse regular and within target range for patient.Marland Kitchen. Respirations regular, non-labored and within target range.. Temperature is normal and within the target range for the patient.Marland Kitchen. appears in no distress. Cardiovascular Pedal pulses palpable and strong bilaterally.. Notes Wound exam; the patient has obtained his external compression pumps. He is using them twice a day and appears to be compliant. There is no open area on his skin although he still has 2+ pitting edem his lymphedema is responding to these measures a. Electronic Signature(s) Signed: 06/21/2017 5:23:04 PM By: [name], [name] MD Entered By: [name], [name] on 06/21/2017 14:34:55 [name], Nathan Kirby C. ([id number]) -------------------------------------------------------------------------------- Physician Orders Details Nathan Kirby DewWALKER, Nathan Kirby Date of Service: 06/21/2017 1:30 PM Patient Name: C. Patient Account Number: [id number] Medical Record Treating RN: Phillis Haggisinkerton, Debi [id number] Number: Other Clinician: Date of Birth/Sex: 1933/09/28 (83 y.o. Male) Treating [name], [name] Primary Care Provider: Daniel NonesKLEIN, [name] Provider/Extender:  G Referring Provider: [name], [name] Weeks in Treatment: 7 Verbal / Phone Orders: Yes Clinician: Ashok CordiaPinkerton, Debi Read Back and Verified: Yes Diagnosis Coding [facility] Discharge from Wound Care Center - Wear your lymphedema pumps twice a day. Wear your compression stockings daily and take them off at night. Keep legs moisturized. Please call our office if you have any questions or concerns. Electronic Signature(s) Signed: 06/21/2017 4:38:41 PM By: Alejandro MullingPinkerton, Debra Signed: 06/21/2017 5:23:04 PM By: [name], [name] MD Entered By: Alejandro MullingPinkerton, Debra on 06/21/2017 13:51:56 [name], Nathan Kirby C. ([id number]) -------------------------------------------------------------------------------- Problem List Details Nathan Kirby DewWALKER, Nathan Kirby Date of Service: 06/21/2017 1:30 PM Patient Name: C. Patient Account Number: [id number] Medical Record Treating RN: Phillis Haggisinkerton, Debi [id number] Number: Other Clinician: Date of Birth/Sex: 1933/09/28 (83 y.o. Male) Treating [name], [name] Primary Care Provider: Daniel NonesKLEIN, [name] Provider/Extender: G Referring Provider: [name], [name] Weeks in Treatment: 7 Active Problems ICD-10 Encounter Code Description Active Date Diagnosis I87.323 Chronic venous hypertension (idiopathic) with 05/03/2017 Yes inflammation of bilateral lower extremity I89.0 Lymphedema, not elsewhere classified 05/03/2017 Yes L97.211 Non-pressure chronic ulcer of right calf limited to 05/03/2017 Yes breakdown of skin Inactive Problems Resolved Problems Electronic Signature(s) Signed: 06/21/2017 5:23:04 PM By: [name], [name] MD Entered By: [name], [name] on 06/21/2017 14:31:54 [name], Nathan Kirby C. ([id number]) -------------------------------------------------------------------------------- Progress Note Details Nathan Kirby DewWALKER, Nathan Kirby Date of Service: 06/21/2017 1:30 PM Patient Name: C. Patient Account Number: [id number] Medical Record Treating RN: Phillis Haggisinkerton, Debi [id number] Number: Other  Clinician: Date of Birth/Sex: 1933/09/28 (83 y.o. Male) Treating [name], [name] Primary Care Provider: Daniel NonesKLEIN, [name] Provider/Extender: G Referring Provider: [name], [name] Weeks in Treatment: 7 Subjective History of Present Illness (HPI) Problem is he can't say anything to these people because. Very pleasant 83 year old with past medical history of atrial fibrillation (on Coumadin) and lymphedema. No history of diabetes or peripheral vascular disease. He fell on a rock in mid November 2015 and suffered a laceration and hematoma  on his left anterior calf. He underwent suture repair in the emergency room. He subsequently developed an infected hematoma, was hospitalized, and underwent incision, drainage, and debridement in the operating room on 10/28/2014 and 10/30/2014 by Dr. Renda Rolls. Cultures grew coag-negative staph. He was discharged on 11/04/2014 on doxycycline and Augmentin, which he has completed. Initially presented [facility] in Dec 2015. No significant pain. No rest pain or claudication. ABI not obtained secondary to hematoma. Normal on the contralateral leg. Ambulating per his baseline with a [place]. Managed with regular debridements, silver/collagen dressing changes, and compression garments He returns to clinic for follow-up and is without complaints. No pain. No drainage. No fever or chills. 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 In spite of this he doesn't seem to of had a really impressive wound history. As noted above he was seen here in 2016 with a traumatic wound and secondary hematoma on the left anterior leg. He is also had a basal cell carcinoma removed on the right leg by his dermatologist Dr. Earlean Polka. He uses 30-40 mm compression stockings and is compliant with these. He tells me he is very careful. Several weeks ago he developed increasing pain and  swelling in the right greater than left leg he was seen by his primary physician and I think treated with parenteral antibiotics and then oral antibiotics some combination of Doxy and Cipro. He was felt to have cellulitis of the right leg. A duplex ultrasound of the right leg was apparently negative for DVT although I cannot pull this up in terms of her report. Primary physician's notes on 5/21 suggest a shallow ulcer on the right leg. The patient states he didn't really have an ulcer he was just weeping edema. He does take Lasix 40 mg daily when necessary patient has noncompressible ABIs bilaterally in this clinic. I don't see any formal arterial studies. He has a history of atrial fibrillation on Coumadin he is not a diabetic the patient was seen here in this clinic in 2016 however he was not seen by any of the existing physician's or providers. 05/17/17; the patient has no improvement in his leg measurements using 30-40 compression. He has bilateral chronic venous inflammation and secondary lymphedema. He has polypoid outgrows from his skin secondary to stage III lymphedema. He has no current evidence of cellulitis MYKEL, SPONAUGLE ([id number]) 05/31/17; the patient had no improvement in his leg measurements using 30-40 mm compression and he can no longer get these on. He is using 20-30 mm compression now. 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 He has obtained compression pumps for his stage III bilateral lymphedema. The circumference of his legs is completely better. He is using these twice a day for 1 hour. He also has 20-30 mm compression stockings and is using lotion. He has no open wounds Objective Constitutional Sitting or standing Blood Pressure is within target range for patient.. Pulse regular and within target range for  patient.Marland Kitchen Respirations regular, non-labored and within target range.. Temperature is normal and within the target range for the patient.Marland Kitchen appears in no distress. Vitals Time Taken: 1:31 PM, Height: 68 in, Weight: 208 lbs, BMI: 31.6, Temperature: 97.7 F, Pulse: 58 bpm, Respiratory Rate: 18 breaths/min, Blood Pressure: 140/66 mmHg, Pulse Oximetry: 100 %. Cardiovascular  Pedal pulses palpable and strong bilaterally.. General Notes: Wound exam; the patient has obtained his external compression pumps. He is using them twice a day and appears to be compliant. There is no open area on his skin although he still has 2+ pitting edem his lymphedema is responding to these measures a. Assessment Active Problems ICD-10 I87.323 - Chronic venous hypertension (idiopathic) with inflammation of bilateral lower extremity I89.0 - Lymphedema, not elsewhere classified L97.211 - Non-pressure chronic ulcer of right calf limited to breakdown of skin [name], Trindon C. ([id number]) [facility] - Wear your lymphedema pumps twice a day. Wear your compression stockings daily and take them off at night. Keep legs moisturized. Please call our office if you have any questions or concerns. #1 at this point the patient can be discharged from the wound care center #2 continue twice a day external compression pump use, 20-30 mm stockings and lubrication to his skin in his legs. #3 all of the questions he had were answered. Electronic Signature(s) Signed: 06/21/2017 5:23:04 PM By: [name], [name] MD Entered By: [name], [name] on 06/21/2017 14:35:49 [name], Nathan Kirby C. ([id number]) -------------------------------------------------------------------------------- SuperBill Details Nathan Kirby DewWALKER, Nathan Kirby Date of Service: 06/21/2017 Patient Name: C. Patient Account Number: [id number] Medical Record Treating RN: Phillis Haggisinkerton, Debi [id number] Number: Other Clinician: Date of  Birth/Sex: 1933/06/04 (83 y.o. Male) Treating [name], [name] Primary Care Provider: Daniel NonesKLEIN, [name] Provider/Extender: G Referring Provider: [name], [name] Weeks in Treatment: 7 Diagnosis Coding ICD-10 Codes Code Description I87.323 Chronic venous hypertension (idiopathic) with inflammation of bilateral lower extremity I89.0 Lymphedema, not elsewhere classified L97.211 Non-pressure chronic ulcer of right calf limited to breakdown of skin Facility Procedures CPT4 Code: [id number] Description: [id number] - WOUND CARE VISIT-LEV 2 EST PT Modifier: Quantity: 1 Physician Procedures CPT4: Description Modifier Quantity Code [id number] - WC PHYS LEVEL 2 - EST PT 1 ICD-10 Description Diagnosis I87.323 Chronic venous hypertension (idiopathic) with inflammation of bilateral lower extremity I89.0 Lymphedema, not elsewhere classified Electronic Signature(s) Signed: 06/22/2017 6:25:53 PM By: [name], [name] MD Previous Signature: 06/21/2017 5:23:04 PM Version By: [name], [name] MD Entered By: Francie MassingKelly, Tia on 06/22/2017 15:23:17

## 2017-07-30 DEATH — deceased
